# Patient Record
Sex: Female | Born: 1995 | Hispanic: Yes | Marital: Single | Smoking: Never smoker
Health system: Southern US, Community
[De-identification: ages and names within clinical notes are randomized; demographics above are authoritative.]

## PROBLEM LIST (undated history)

## (undated) DIAGNOSIS — D649 Anemia, unspecified: Secondary | ICD-10-CM

## (undated) DIAGNOSIS — Z789 Other specified health status: Secondary | ICD-10-CM

## (undated) HISTORY — PX: NO PAST SURGERIES: SHX2092

## (undated) HISTORY — DX: Other specified health status: Z78.9

---

## 2020-09-11 ENCOUNTER — Inpatient Hospital Stay (HOSPITAL_COMMUNITY): Payer: Self-pay

## 2020-09-11 ENCOUNTER — Other Ambulatory Visit: Payer: Self-pay

## 2020-09-11 ENCOUNTER — Inpatient Hospital Stay (HOSPITAL_COMMUNITY)
Admission: AD | Admit: 2020-09-11 | Discharge: 2020-09-11 | Disposition: A | Discharge status: Home / Self Care | Payer: Self-pay | Attending: Obstetrics and Gynecology | Admitting: Obstetrics and Gynecology

## 2020-09-11 ENCOUNTER — Encounter (HOSPITAL_COMMUNITY): Payer: Self-pay | Admitting: Obstetrics and Gynecology

## 2020-09-11 DIAGNOSIS — O200 Threatened abortion: Secondary | ICD-10-CM | POA: Insufficient documentation

## 2020-09-11 DIAGNOSIS — Z6740 Type O blood, Rh positive: Secondary | ICD-10-CM | POA: Insufficient documentation

## 2020-09-11 DIAGNOSIS — Z3A10 10 weeks gestation of pregnancy: Secondary | ICD-10-CM | POA: Insufficient documentation

## 2020-09-11 DIAGNOSIS — O4690 Antepartum hemorrhage, unspecified, unspecified trimester: Secondary | ICD-10-CM

## 2020-09-11 LAB — CBC WITH DIFFERENTIAL/PLATELET
Abs Immature Granulocytes: 0.01 10*3/uL (ref 0.00–0.07)
Basophils Absolute: 0 10*3/uL (ref 0.0–0.1)
Basophils Relative: 1 %
Eosinophils Absolute: 0.2 10*3/uL (ref 0.0–0.5)
Eosinophils Relative: 2 %
HCT: 33.8 % — ABNORMAL LOW (ref 36.0–46.0)
Hemoglobin: 11.5 g/dL — ABNORMAL LOW (ref 12.0–15.0)
Immature Granulocytes: 0 %
Lymphocytes Relative: 31 %
Lymphs Abs: 2 10*3/uL (ref 0.7–4.0)
MCH: 29.6 pg (ref 26.0–34.0)
MCHC: 34 g/dL (ref 30.0–36.0)
MCV: 86.9 fL (ref 80.0–100.0)
Monocytes Absolute: 0.5 10*3/uL (ref 0.1–1.0)
Monocytes Relative: 7 %
Neutro Abs: 3.9 10*3/uL (ref 1.7–7.7)
Neutrophils Relative %: 59 %
Platelets: 240 10*3/uL (ref 150–400)
RBC: 3.89 MIL/uL (ref 3.87–5.11)
RDW: 12.1 % (ref 11.5–15.5)
WBC: 6.6 10*3/uL (ref 4.0–10.5)
nRBC: 0 % (ref 0.0–0.2)

## 2020-09-11 LAB — URINALYSIS, ROUTINE W REFLEX MICROSCOPIC
Bilirubin Urine: NEGATIVE
Glucose, UA: NEGATIVE mg/dL
Ketones, ur: NEGATIVE mg/dL
Leukocytes,Ua: NEGATIVE
Nitrite: NEGATIVE
Protein, ur: NEGATIVE mg/dL
Specific Gravity, Urine: 1.005 (ref 1.005–1.030)
pH: 6 (ref 5.0–8.0)

## 2020-09-11 LAB — COMPREHENSIVE METABOLIC PANEL
ALT: 14 U/L (ref 0–44)
AST: 15 U/L (ref 15–41)
Albumin: 4.1 g/dL (ref 3.5–5.0)
Alkaline Phosphatase: 38 U/L (ref 38–126)
Anion gap: 10 (ref 5–15)
BUN: 6 mg/dL (ref 6–20)
CO2: 26 mmol/L (ref 22–32)
Calcium: 9.5 mg/dL (ref 8.9–10.3)
Chloride: 99 mmol/L (ref 98–111)
Creatinine, Ser: 0.56 mg/dL (ref 0.44–1.00)
GFR, Estimated: >60 mL/min (ref >60)
Glucose, Bld: 86 mg/dL (ref 70–99)
Potassium: 3.9 mmol/L (ref 3.5–5.1)
Sodium: 135 mmol/L (ref 135–145)
Total Bilirubin: 0.8 mg/dL (ref 0.3–1.2)
Total Protein: 6.6 g/dL (ref 6.5–8.1)

## 2020-09-11 LAB — WET PREP, GENITAL
Clue Cells Wet Prep HPF POC: NONE SEEN
Sperm: NONE SEEN
Trich, Wet Prep: NONE SEEN
Yeast Wet Prep HPF POC: NONE SEEN

## 2020-09-11 LAB — HCG, QUANTITATIVE, PREGNANCY: hCG, Beta Chain, Quant, S: 28007 m[IU]/mL — ABNORMAL HIGH (ref <5)

## 2020-09-11 LAB — HIV ANTIBODY (ROUTINE TESTING W REFLEX): HIV Screen 4th Generation wRfx: NONREACTIVE

## 2020-09-11 LAB — ABO/RH: ABO/RH(D): O POS

## 2020-09-11 NOTE — MAU Note (Signed)
+  HPT about a month ago.  Confirmation appt was today. Past 2 wks, has had some bleeding and brown d/c both. Was told to come here. [facility].  Cramping in lower abd.

## 2020-09-11 NOTE — MAU Provider Note (Signed)
History     CSN: [id number]  Arrival date and time: 09/11/20 1622   Event Date/Time   First Provider Initiated Contact with Patient 09/11/20 1809      Chief Complaint  Patient presents with   Vaginal Bleeding   Abdominal Pain   HPI  [name] is a 25 y.o. female G3P1011 @ 109w6d  here in MAU with complaints of vaginal bleeding x 2 weeks. Report dark blood when she wipes. She reports bilateral lower abdominal pain that comes and goes. She has not taken anything for the pain. She reports the pain as 4/10.   OB History     Gravida  3   Para  1   Term  1   Preterm      AB  1   Living  1      SAB  1   IAB      Ectopic      Multiple  1   Live Births  1           Allergies: No Known Allergies  Medications Prior to Admission  Medication Sig Dispense Refill Last Dose   ferrous sulfate 325 (65 FE) MG tablet Take 325 mg by mouth daily with breakfast.      Multiple Vitamin (MULTIVITAMIN) tablet Take 1 tablet by mouth daily.      Results for orders placed or performed during the hospital encounter of 09/11/20 (from the past 48 hour(s))  Urinalysis, Routine w reflex microscopic Urine, Clean Catch     Status: Abnormal   Collection Time: 09/11/20  6:02 PM  Result Value Ref Range   Color, Urine STRAW (A) YELLOW   APPearance CLEAR CLEAR   Specific Gravity, Urine 1.005 1.005 - 1.030   pH 6.0 5.0 - 8.0   Glucose, UA NEGATIVE NEGATIVE mg/dL   Hgb urine dipstick SMALL (A) NEGATIVE   Bilirubin Urine NEGATIVE NEGATIVE   Ketones, ur NEGATIVE NEGATIVE mg/dL   Protein, ur NEGATIVE NEGATIVE mg/dL   Nitrite NEGATIVE NEGATIVE   Leukocytes,Ua NEGATIVE NEGATIVE   RBC / HPF 0-5 0 - 5 RBC/hpf   WBC, UA 0-5 0 - 5 WBC/hpf   Bacteria, UA RARE (A) NONE SEEN   Squamous Epithelial / LPF 0-5 0 - 5    Comment: Performed at [facility], [address], [place], [place] [address]  Wet prep, genital     Status: Abnormal   Collection Time: 09/11/20  6:30 PM   Result Value Ref Range   Yeast Wet Prep HPF POC NONE SEEN NONE SEEN   Trich, Wet Prep NONE SEEN NONE SEEN   Clue Cells Wet Prep HPF POC NONE SEEN NONE SEEN   WBC, Wet Prep HPF POC FEW (A) NONE SEEN   Sperm NONE SEEN     Comment: Performed at [facility], [address] [place], [place] [address]  CBC with Differential/Platelet     Status: Abnormal   Collection Time: 09/11/20  6:34 PM  Result Value Ref Range   WBC 6.6 4.0 - 10.5 K/uL   RBC 3.89 3.87 - 5.11 MIL/uL   Hemoglobin 11.5 (L) 12.0 - 15.0 g/dL   HCT 86.7 (L) 61.9 - 50.9 %   MCV 86.9 80.0 - 100.0 fL   MCH 29.6 26.0 - 34.0 pg   MCHC 34.0 30.0 - 36.0 g/dL   RDW 32.6 71.2 - 45.8 %   Platelets 240 150 - 400 K/uL   nRBC 0.0 0.0 -  0.2 %   Neutrophils Relative % 59 %   Neutro Abs 3.9 1.7 - 7.7 K/uL   Lymphocytes Relative 31 %   Lymphs Abs 2.0 0.7 - 4.0 K/uL   Monocytes Relative 7 %   Monocytes Absolute 0.5 0.1 - 1.0 K/uL   Eosinophils Relative 2 %   Eosinophils Absolute 0.2 0.0 - 0.5 K/uL   Basophils Relative 1 %   Basophils Absolute 0.0 0.0 - 0.1 K/uL   Immature Granulocytes 0 %   Abs Immature Granulocytes 0.01 0.00 - 0.07 K/uL    Comment: Performed at [facility], [address], [place], [place] [address]  ABO/Rh     Status: None   Collection Time: 09/11/20  6:34 PM  Result Value Ref Range   ABO/RH(D)      O POS Performed at [facility], [address], [place], [place] [address]     US OB LESS THAN 14 WEEKS WITH [place] TRANSVAGINAL  Result Date: 09/11/2020 CLINICAL DATA:  Pregnant patient.  Cramping. EXAM: OBSTETRIC <14 WK [place] AND TRANSVAGINAL OB US TECHNIQUE: Both transabdominal and transvaginal ultrasound examinations were performed for complete evaluation of the gestation as well as the maternal uterus, adnexal regions, and pelvic cul-de-sac. Transvaginal technique was performed to assess early pregnancy. COMPARISON:  None. FINDINGS: Intrauterine gestational sac: Single Yolk sac:  Not Visualized.  Embryo:  Possible fetal pole. Cardiac Activity: Not Visualized. MSD:   mm    w     d CRL:  2.6 mm   5 w   5 d                  [place] EDC: May 09, 2021 Subchorionic hemorrhage:  None visualized. Maternal uterus/adnexae: Normal. IMPRESSION: 1. There appears to be a gestational sac with a possible fetal pole but no yolk sac. No cardiac activity identified in the possible fetal pole. Findings are suspicious but not yet definitive for failed pregnancy. Recommend follow-up US in 10-14 days for definitive diagnosis. This recommendation follows SRU consensus guidelines: Diagnostic Criteria for Nonviable Pregnancy Early in the First Trimester. Malva Limes Med 2013; 098:1191-47. 2. No other abnormalities. Electronically Signed   By: Gerome Sam III M.D   On: 09/11/2020 19:25      Review of Systems  Constitutional:  Negative for fever.  Gastrointestinal:  Positive for abdominal pain. Negative for nausea and vomiting.  Genitourinary:  Positive for vaginal bleeding.  Physical Exam   Blood pressure (!) 100/53, pulse 73, temperature 98.6 F (37 C), temperature source Oral, resp. rate 17, height 5\' 1"  (1.549 m), weight 54.4 kg, last menstrual period 06/27/2020, SpO2 100 %, unknown if currently breastfeeding.  Physical Exam Constitutional:      General: She is not in acute distress.    Appearance: She is well-developed. She is not ill-appearing, toxic-appearing or diaphoretic.  Abdominal:     Tenderness: There is abdominal tenderness in the right lower quadrant, periumbilical area, suprapubic area and left lower quadrant. There is no guarding or rebound.  Genitourinary:    Comments: Wet prep and GC collected without speculum. Small amount of pink/ dark red blood noted on swabs. Musculoskeletal:        General: Normal range of motion.  Neurological:     Mental Status: She is alert and oriented to person, place, and time.  Psychiatric:        Behavior: Behavior normal.   MAU Course   Procedures None  MDM  O positive  blood type.  Reviewed [place] in detail with the patient.    Assessment and Plan   A:  1. Type O blood, Rh positive   2. Vaginal bleeding during pregnancy   3. Threatened miscarriage     P:  Discharge home in stable condition Viability scan in 7 days- order placed Bleeding precautions Return to MAU if symptoms worsen   Remmy Riffe, Harolyn Rutherford, NP 09/14/2020 8:41 AM

## 2020-09-14 LAB — GC/CHLAMYDIA PROBE AMP (~~LOC~~) NOT AT ARMC
Chlamydia: NEGATIVE
Comment: NEGATIVE
Comment: NORMAL
Neisseria Gonorrhea: NEGATIVE

## 2020-09-15 ENCOUNTER — Inpatient Hospital Stay (HOSPITAL_COMMUNITY): Payer: Self-pay

## 2020-09-15 ENCOUNTER — Inpatient Hospital Stay (HOSPITAL_COMMUNITY)
Admission: AD | Admit: 2020-09-15 | Discharge: 2020-09-16 | Disposition: A | Discharge status: Home / Self Care | Payer: Self-pay | Attending: Obstetrics and Gynecology | Admitting: Obstetrics and Gynecology

## 2020-09-15 ENCOUNTER — Other Ambulatory Visit: Payer: Self-pay

## 2020-09-15 ENCOUNTER — Encounter (HOSPITAL_COMMUNITY): Payer: Self-pay | Admitting: Obstetrics and Gynecology

## 2020-09-15 DIAGNOSIS — O020 Blighted ovum and nonhydatidiform mole: Secondary | ICD-10-CM | POA: Insufficient documentation

## 2020-09-15 DIAGNOSIS — Z3A11 11 weeks gestation of pregnancy: Secondary | ICD-10-CM | POA: Insufficient documentation

## 2020-09-15 DIAGNOSIS — O4690 Antepartum hemorrhage, unspecified, unspecified trimester: Secondary | ICD-10-CM

## 2020-09-15 LAB — CBC
HCT: 33.5 % — ABNORMAL LOW (ref 36.0–46.0)
Hemoglobin: 11.4 g/dL — ABNORMAL LOW (ref 12.0–15.0)
MCH: 29.7 pg (ref 26.0–34.0)
MCHC: 34 g/dL (ref 30.0–36.0)
MCV: 87.2 fL (ref 80.0–100.0)
Platelets: 245 10*3/uL (ref 150–400)
RBC: 3.84 MIL/uL — ABNORMAL LOW (ref 3.87–5.11)
RDW: 12.1 % (ref 11.5–15.5)
WBC: 7.8 10*3/uL (ref 4.0–10.5)
nRBC: 0 % (ref 0.0–0.2)

## 2020-09-15 LAB — HCG, QUANTITATIVE, PREGNANCY: hCG, Beta Chain, Quant, S: 22670 m[IU]/mL — ABNORMAL HIGH (ref <5)

## 2020-09-15 NOTE — MAU Note (Signed)
PT SAYS SHE WAS HERE Last Friday  for same-   VAG BLEEDING STARTED 2 WEEKS AGO . ON Sunday BLEEDING BECAME HEAVIER AND CLOTS-  TONIGHT NO PAD - BUT HAS BLEEDING WHEN SHE WIPES.  HAS L SIDE ABD PAIN- STARTED Sunday NIGHT - TOOK TYLENOL Q6 HRS- NO RELIEF. NO TYLENOL TODAY

## 2020-09-16 ENCOUNTER — Encounter (HOSPITAL_COMMUNITY): Payer: Self-pay | Admitting: Obstetrics and Gynecology

## 2020-09-16 DIAGNOSIS — O020 Blighted ovum and nonhydatidiform mole: Secondary | ICD-10-CM

## 2020-09-16 NOTE — MAU Provider Note (Signed)
History  Chief Complaint:  Abdominal Pain and Vaginal Bleeding  Kathryn Benjamin is a 25 y.o. G35P1011 female at [redacted]w[redacted]d by LMP presenting with continued vaginal bleeding and pain.  Seen in MAU 6/10 w/ same complaints, at that time BHCG 28,007 and u/s revealed GS, YS not visualized, possible fetal pole [redacted]w[redacted]d w/o FCA. Reports bleeding became heavier over weekend w/ clots, intermittent LLQ pain since Sunday. No fever/chills. Denies abnormal discharge, itching/odor/irritation.    Obstetrical History: OB History     Gravida  3   Para  1   Term  1   Preterm      AB  1   Living  1      SAB  1   IAB      Ectopic      Multiple  1   Live Births  1           Past Medical History: Past Medical History:  Diagnosis Date   Medical history non-contributory     Past Surgical History: Past Surgical History:  Procedure Laterality Date   NO PAST SURGERIES      Social History: Social History   Socioeconomic History   Marital status: Single    Spouse name: Not on file   Number of children: Not on file   Years of education: Not on file   Highest education level: Not on file  Occupational History   Not on file  Tobacco Use   Smoking status: Never   Smokeless tobacco: Never  Substance and Sexual Activity   Alcohol use: Not Currently   Drug use: Never   Sexual activity: Yes  Other Topics Concern   Not on file  Social History Narrative   Not on file   Social Determinants of Health   Financial Resource Strain: Not on file  Food Insecurity: Not on file  Transportation Needs: Not on file  Physical Activity: Not on file  Stress: Not on file  Social Connections: Not on file    Allergies: No Known Allergies  Medications Prior to Admission  Medication Sig Dispense Refill Last Dose   ferrous sulfate 325 (65 FE) MG tablet Take 325 mg by mouth daily with breakfast.   09/15/2020   Multiple Vitamin (MULTIVITAMIN) tablet Take 1 tablet by mouth daily.   09/14/2020     Review of Systems  Pertinent pos/neg as indicated in HPI  Physical Exam  Blood pressure (!) 100/47, pulse 70, temperature 98.4 F (36.9 C), temperature source Oral, resp. rate 20, height 5\' 1"  (1.549 m), weight 54.5 kg, last menstrual period 06/27/2020, unknown if currently breastfeeding. General appearance: alert, cooperative, and no distress Lungs: clear to auscultation bilaterally, normal effort Heart: regular rate and rhythm Abdomen: gravid, soft, slightly tender LLQ Extremities: no edema  Spec exam by Dr. 06/29/2020: cx visually closed, small amt dark red blood in vault, no active bleeding from os Cultures/Specimens: none   MAU Course   Labs:  Results for orders placed or performed during the hospital encounter of 09/15/20 (from the past 24 hour(s))  CBC     Status: Abnormal   Collection Time: 09/15/20  8:01 PM  Result Value Ref Range   WBC 7.8 4.0 - 10.5 K/uL   RBC 3.84 (L) 3.87 - 5.11 MIL/uL   Hemoglobin 11.4 (L) 12.0 - 15.0 g/dL   HCT 09/17/20 (L) 57.8 - 46.9 %   MCV 87.2 80.0 - 100.0 fL   MCH 29.7 26.0 - 34.0 pg   MCHC 34.0  30.0 - 36.0 g/dL   RDW 96.7 59.1 - 63.8 %   Platelets 245 150 - 400 K/uL   nRBC 0.0 0.0 - 0.2 %  hCG, quantitative, pregnancy     Status: Abnormal   Collection Time: 09/15/20  8:01 PM  Result Value Ref Range   hCG, Beta Chain, Quant, S 22,670 (H) <5 mIU/mL    Imaging:  EXAM: OBSTETRIC <14 WK [place] AND TRANSVAGINAL OB US  TECHNIQUE: Both transabdominal and transvaginal ultrasound examinations were performed for complete evaluation of the gestation as well as the maternal uterus, adnexal regions, and pelvic cul-de-sac. Transvaginal technique was performed to assess early pregnancy.  COMPARISON:  September 11, 2020   FINDINGS: Intrauterine gestational sac: Visualized-single with slight irregularity to the sac contour and debris within the sac  Yolk sac:  Not visualized  Embryo:  Not visualized  Cardiac Activity: Not visualized  MSD: 20 mm   6  w   6 d  Subchorionic hemorrhage:  None visualized.  Maternal uterus/adnexae: Cervical os closed. Left ovary measures 3.0 x 2.0 x 1.7 cm. Right ovary measures 3.0 x 1.8 x 2.0 cm. No extrauterine pelvic mass. No free pelvic fluid.   IMPRESSION: There is a slightly irregular gestational sac measuring 20 mm in size which contains debris but no demonstrable fetal pole. No yolk sac appreciable. Findings are suspicious but not yet definitive for failed pregnancy. Recommend follow-up US in 10-14 days for definitive diagnosis. This recommendation follows SRU consensus guidelines: Diagnostic Criteria for Nonviable Pregnancy Early in the First Trimester. Malva Limes Med 2013; 466:5993-57.  Electronically Signed   By: Bretta Bang III M.D.   On: 09/15/2020 21:54  Discussed falling HCGs (was 28,007 4d ago, now 22,670) and u/s report today and the one on 6/10 w/ Dr. Debroah Loop. Failed anembryonic pregnancy dx. Offer expectant management vs cytotec, and f/u in office.  Assessment and Plan  A:  [redacted]w[redacted]d [facility]UP  G3P1011  Failed/anembryonic pregnancy  Rh+ P:  Discharge home  Offered expectant management vs cytotec- pt wants expectant management for now  Reviewed warning s/s, reasons to seek care (hemorrhaging, severe pain, infection, etc)  Already had f/u u/s scheduled 6/23  Note routed to [facility] to call pt to schedule f/u in office next week   Cheral Marker CNM,WHNP-BC 6/15/202212:05 AM

## 2020-09-22 ENCOUNTER — Other Ambulatory Visit: Payer: Self-pay

## 2020-09-22 ENCOUNTER — Ambulatory Visit (INDEPENDENT_AMBULATORY_CARE_PROVIDER_SITE_OTHER): Payer: Self-pay | Admitting: Obstetrics & Gynecology

## 2020-09-22 ENCOUNTER — Encounter: Payer: Self-pay | Admitting: Obstetrics & Gynecology

## 2020-09-22 VITALS — BP 108/64 | HR 74 | Wt 120.2 lb

## 2020-09-22 DIAGNOSIS — O020 Blighted ovum and nonhydatidiform mole: Secondary | ICD-10-CM

## 2020-09-22 NOTE — Progress Notes (Signed)
Ultrasounds Results Note  SUBJECTIVE HPI:  [name] is a 25 y.o. G3P1011 who presents to the [facility] for followup ultrasound results due to probable failed pregnancy The patient had cramps and bleeding and passed tissue. Upon review of the patient's records, patient was first seen in MAU on 6/10 for bleeding and pain.   BHCG on that day was 05397.  Ultrasound showed sac possible pole no YS.  Last seen in MAU on 6/15. BHCG was 67341.  Repeat ultrasound is scheduled in 2 days  Past Medical History:  Diagnosis Date   Medical history non-contributory    Past Surgical History:  Procedure Laterality Date   NO PAST SURGERIES     Social History   Socioeconomic History   Marital status: Single    Spouse name: Not on file   Number of children: Not on file   Years of education: Not on file   Highest education level: Not on file  Occupational History   Not on file  Tobacco Use   Smoking status: Never   Smokeless tobacco: Never  Substance and Sexual Activity   Alcohol use: Not Currently   Drug use: Never   Sexual activity: Yes  Other Topics Concern   Not on file  Social History Narrative   Not on file   Social Determinants of Health   Financial Resource Strain: Not on file  Food Insecurity: Food Insecurity Present   Worried About Running Out of Food in the Last Year: Sometimes true   Ran Out of Food in the Last Year: Sometimes true  Transportation Needs: No Transportation Needs   Lack of Transportation (Medical): No   Lack of Transportation (Non-Medical): No  Physical Activity: Not on file  Stress: Not on file  Social Connections: Not on file  Intimate Partner Violence: Not on file   Current Outpatient Medications on File Prior to Visit  Medication Sig Dispense Refill   ferrous sulfate 325 (65 FE) MG tablet Take 325 mg by mouth daily with breakfast. (Patient not taking: Reported on 09/22/2020)     Multiple Vitamin (MULTIVITAMIN) tablet Take 1 tablet by mouth  daily. (Patient not taking: Reported on 09/22/2020)     No current facility-administered medications on file prior to visit.   No Known Allergies  I have reviewed patient's Past Medical Hx, Surgical Hx, Family Hx, Social Hx, medications and allergies.   Review of Systems Review of Systems  Constitutional: Negative for fever and chills.  Gastrointestinal: Negative for nausea, vomiting, abdominal pain, diarrhea and constipation.  Genitourinary: Negative for dysuria.  Musculoskeletal: Negative for back pain.  Neurological: Negative for dizziness and weakness.    Physical Exam  BP 108/64   Pulse 74   Wt 120 lb 3.2 oz (54.5 kg)   LMP 06/27/2020   Breastfeeding Unknown   BMI 22.71 kg/m   GENERAL: Well-developed, well-nourished female in no acute distress.  HEENT: Normocephalic, atraumatic.   LUNGS: Effort normal ABDOMEN: soft, non-tender HEART: Regular rate  SKIN: Warm, dry and without erythema PSYCH: Normal mood and affect NEURO: Alert and oriented x 4  LAB RESULTS No results found for this or any previous visit (from the past 24 hour(s)).  IMAGING US OB LESS THAN 14 WEEKS WITH OB TRANSVAGINAL  Result Date: 09/15/2020 CLINICAL DATA:  Vaginal bleeding EXAM: OBSTETRIC <14 WK [place] AND TRANSVAGINAL OB US TECHNIQUE: Both transabdominal and transvaginal ultrasound examinations were performed for complete evaluation of the gestation as well as the maternal uterus, adnexal regions, and  pelvic cul-de-sac. Transvaginal technique was performed to assess early pregnancy. COMPARISON:  September 11, 2020 FINDINGS: Intrauterine gestational sac: Visualized-single with slight irregularity to the sac contour and debris within the sac Yolk sac:  Not visualized Embryo:  Not visualized Cardiac Activity: Not visualized MSD: 20 mm   6 w   6 d Subchorionic hemorrhage:  None visualized. Maternal uterus/adnexae: Cervical os closed. Left ovary measures 3.0 x 2.0 x 1.7 cm. Right ovary measures 3.0 x 1.8 x 2.0 cm. No  extrauterine pelvic mass. No free pelvic fluid. IMPRESSION: There is a slightly irregular gestational sac measuring 20 mm in size which contains debris but no demonstrable fetal pole. No yolk sac appreciable. Findings are suspicious but not yet definitive for failed pregnancy. Recommend follow-up US in 10-14 days for definitive diagnosis. This recommendation follows SRU consensus guidelines: Diagnostic Criteria for Nonviable Pregnancy Early in the First Trimester. Malva Limes Med 2013; 469:6295-28. Electronically Signed   By: Bretta Bang III M.D.   On: 09/15/2020 21:54   US OB LESS THAN 14 WEEKS WITH OB TRANSVAGINAL  Result Date: 09/11/2020 CLINICAL DATA:  Pregnant patient.  Cramping. EXAM: OBSTETRIC <14 WK [place] AND TRANSVAGINAL OB US TECHNIQUE: Both transabdominal and transvaginal ultrasound examinations were performed for complete evaluation of the gestation as well as the maternal uterus, adnexal regions, and pelvic cul-de-sac. Transvaginal technique was performed to assess early pregnancy. COMPARISON:  None. FINDINGS: Intrauterine gestational sac: Single Yolk sac:  Not Visualized. Embryo:  Possible fetal pole. Cardiac Activity: Not Visualized. MSD:   mm    w     d CRL:  2.6 mm   5 w   5 d                  [place] EDC: May 09, 2021 Subchorionic hemorrhage:  None visualized. Maternal uterus/adnexae: Normal. IMPRESSION: 1. There appears to be a gestational sac with a possible fetal pole but no yolk sac. No cardiac activity identified in the possible fetal pole. Findings are suspicious but not yet definitive for failed pregnancy. Recommend follow-up US in 10-14 days for definitive diagnosis. This recommendation follows SRU consensus guidelines: Diagnostic Criteria for Nonviable Pregnancy Early in the First Trimester. Malva Limes Med 2013; 413:2440-10. 2. No other abnormalities. Electronically Signed   By: Gerome Sam III M.D   On: 09/11/2020 19:25    ASSESSMENT 1. Anembryonic pregnancy    Suspect complete  miscarriage now PLAN Discharge home in stable condition  F/U US at [facility] in 2 days RTC in 2 weeks. Bleeding precautions given  Patient counseled using video interpreter  Adam Phenix, MD  09/22/2020  10:42 AM

## 2020-09-24 ENCOUNTER — Ambulatory Visit (HOSPITAL_COMMUNITY): Admission: RE | Admit: 2020-09-24 | Payer: Self-pay | Source: Ambulatory Visit

## 2020-09-24 ENCOUNTER — Encounter: Payer: Self-pay | Admitting: Obstetrics & Gynecology

## 2020-09-24 ENCOUNTER — Other Ambulatory Visit (HOSPITAL_COMMUNITY): Payer: Self-pay | Admitting: Obstetrics and Gynecology

## 2020-09-24 ENCOUNTER — Ambulatory Visit (HOSPITAL_COMMUNITY)
Admission: RE | Admit: 2020-09-24 | Discharge: 2020-09-24 | Disposition: A | Discharge status: Home / Self Care | Payer: Self-pay | Source: Ambulatory Visit | Attending: Obstetrics and Gynecology | Admitting: Obstetrics and Gynecology

## 2020-09-24 ENCOUNTER — Ambulatory Visit (INDEPENDENT_AMBULATORY_CARE_PROVIDER_SITE_OTHER): Payer: Self-pay | Admitting: Obstetrics & Gynecology

## 2020-09-24 ENCOUNTER — Other Ambulatory Visit: Payer: Self-pay

## 2020-09-24 VITALS — BP 102/54 | HR 61 | Wt 118.9 lb

## 2020-09-24 DIAGNOSIS — O4690 Antepartum hemorrhage, unspecified, unspecified trimester: Secondary | ICD-10-CM | POA: Insufficient documentation

## 2020-09-24 DIAGNOSIS — O200 Threatened abortion: Secondary | ICD-10-CM | POA: Insufficient documentation

## 2020-09-24 DIAGNOSIS — Z6740 Type O blood, Rh positive: Secondary | ICD-10-CM | POA: Insufficient documentation

## 2020-09-24 DIAGNOSIS — O020 Blighted ovum and nonhydatidiform mole: Secondary | ICD-10-CM

## 2020-09-24 IMAGING — US US OB TRANSVAGINAL
1 series · 15 of 27 positions shown · non-contrast
Comparison: [DATE]

CLINICAL DATA: Follow-up viability.  Down trending beta HCG

EXAM:
TRANSVAGINAL OB ULTRASOUND
TECHNIQUE: Transvaginal ultrasound was performed for complete evaluation of the
gestation as well as the maternal uterus, adnexal regions, and
pelvic cul-de-sac.

[Series 1: us ob transvaginal · 27 acquisitions, 15 frames shown]
[im 1/27]
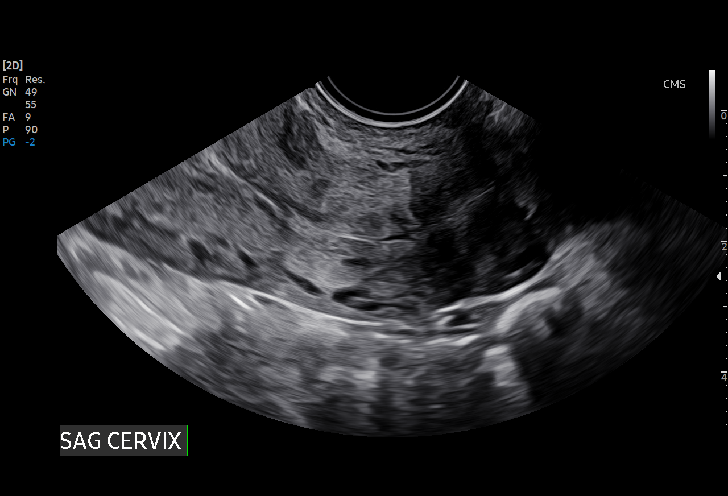
[im 3/27]
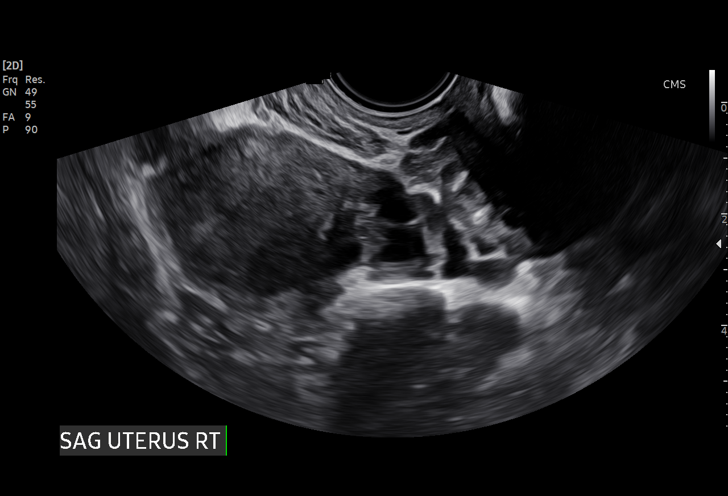
[im 5/27]
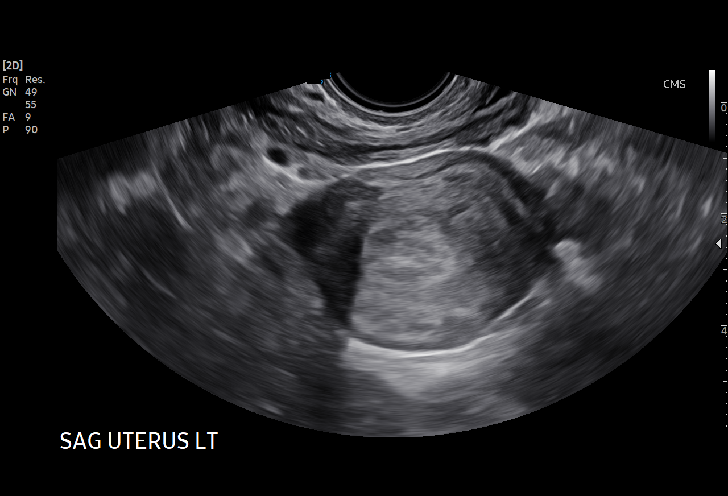
[im 7/27]
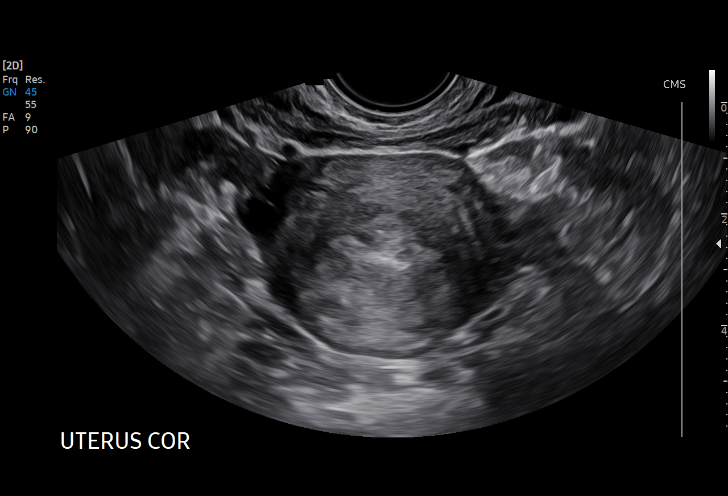
[im 9/27]
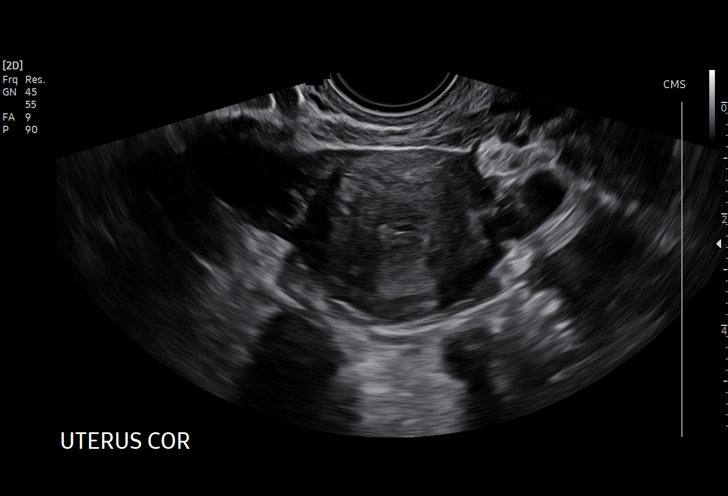
[im 10/27]
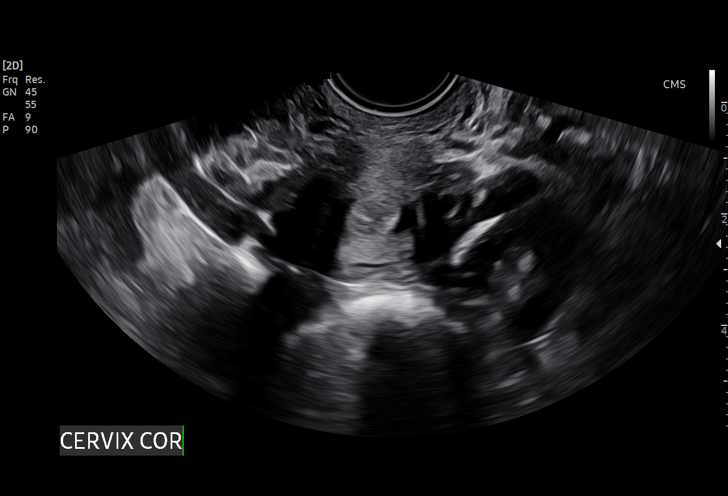
[im 12/27]
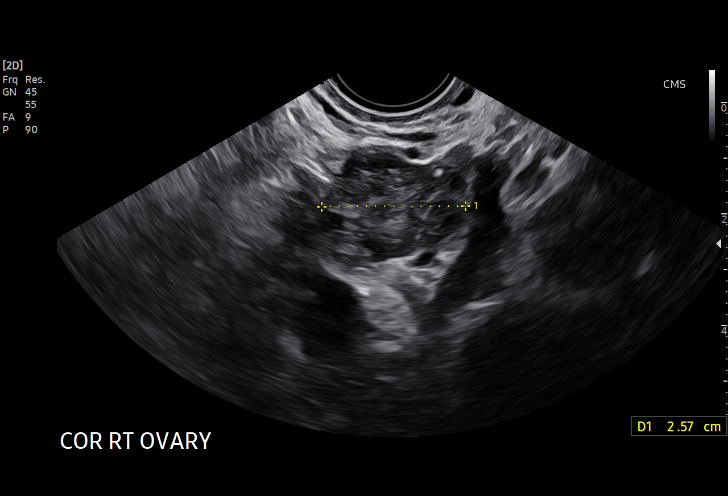
[im 14/27]
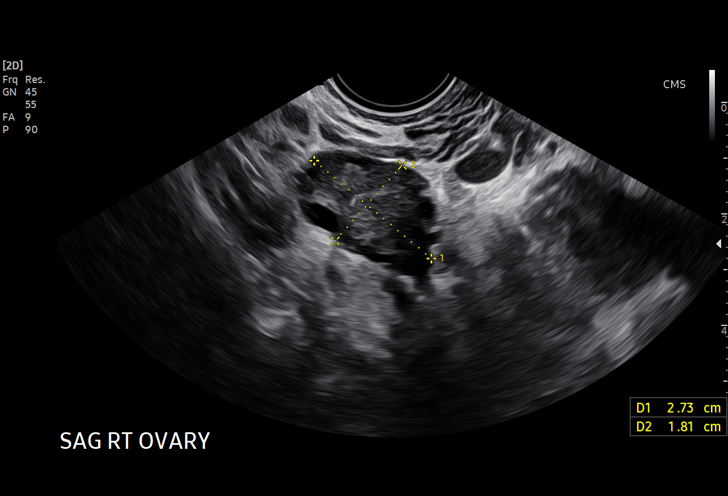
[im 16/27]
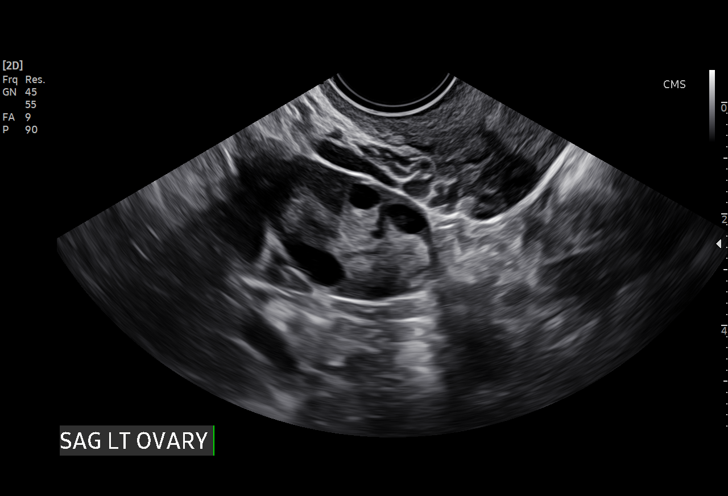
[im 18/27]
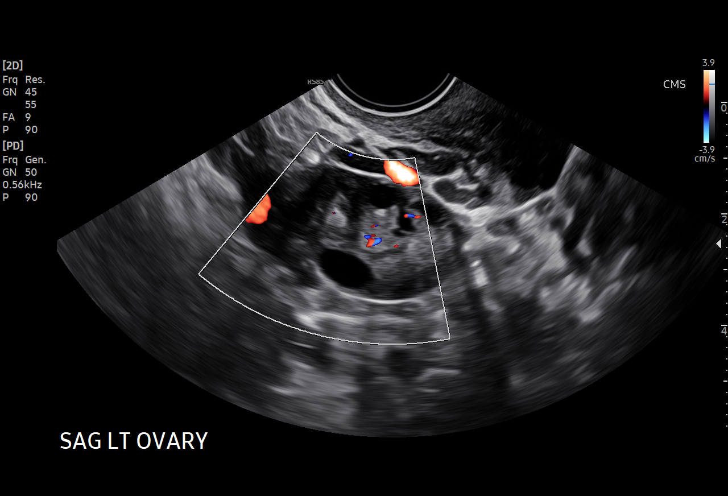
[im 19/27]
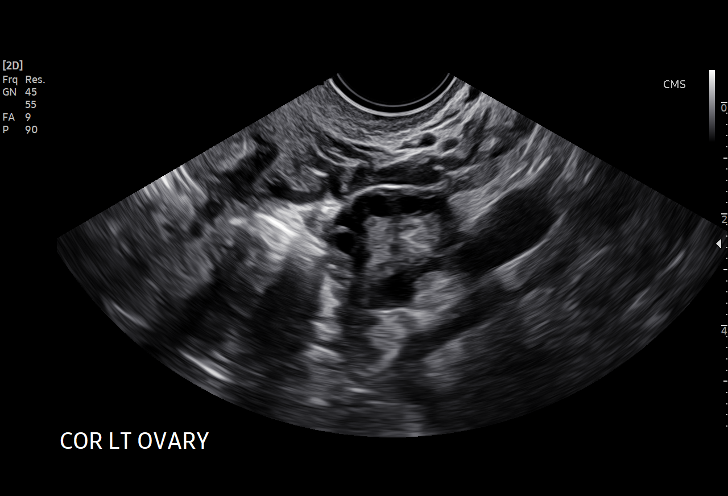
[im 21/27]
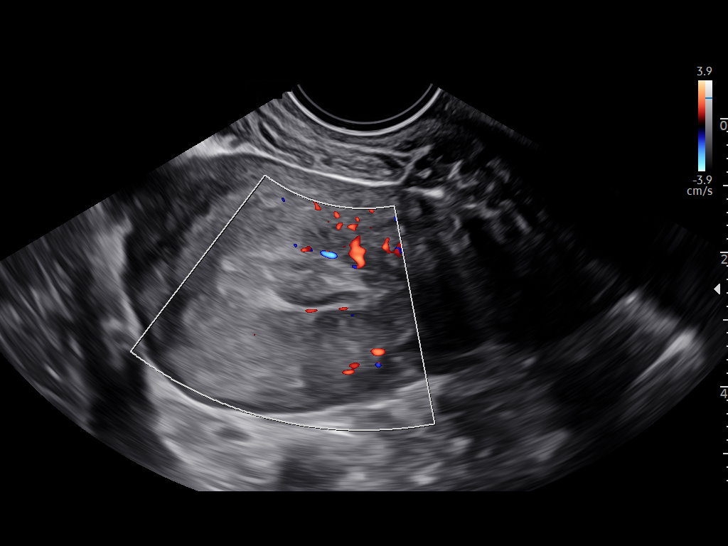
[im 23/27]
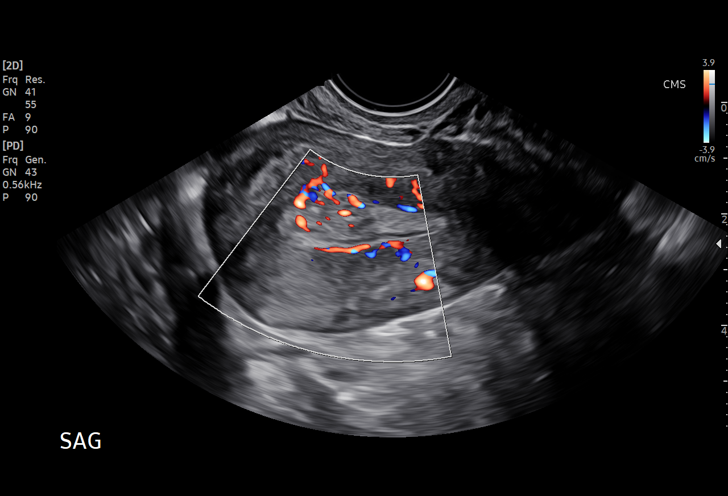
[im 25/27]
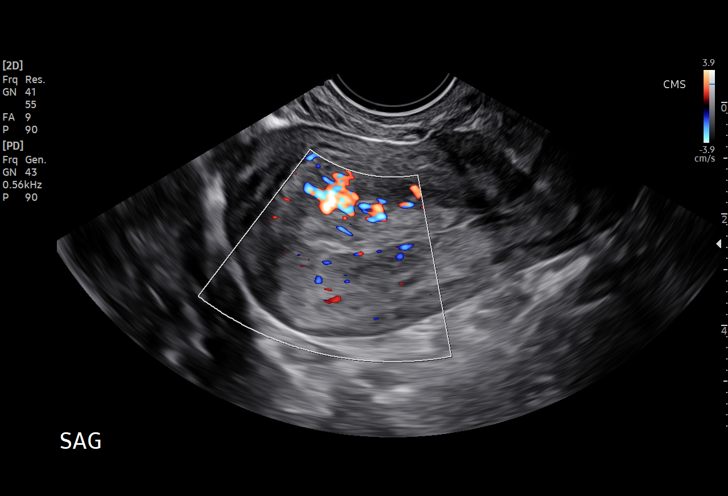
[im 27/27]
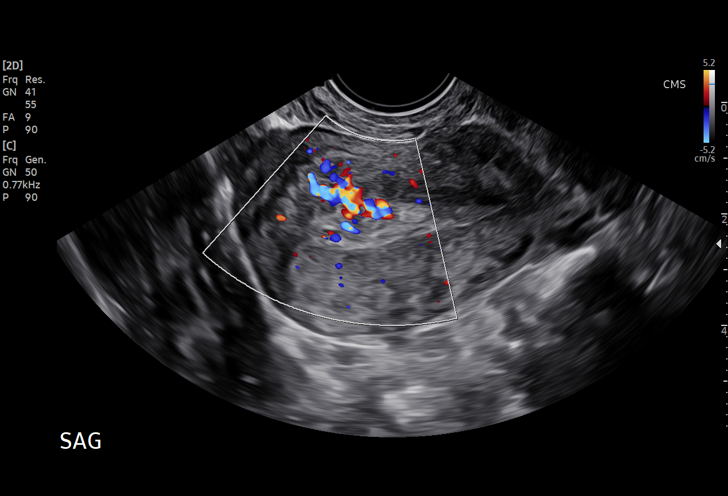

[15 of 27 positions shown; findings below may reference images not displayed]

FINDINGS: Intrauterine gestational sac: None

Yolk sac:  Not Visualized.

Embryo:  Not Visualized.

Cardiac Activity: Not Visualized.

Subchorionic hemorrhage:  None visualized.

Maternal uterus/adnexae: Endometrial complex measures approximately
10 mm in thickness and appears heterogeneous with increased internal
vascularity. Right ovary measures 2.6 x 1.8 x 2.7 cm and appears
unremarkable. Left ovary measures 3.5 x 2.3 x 2.4 cm and appears
unremarkable. No adnexal mass identified. No free fluid within the
pelvis.
IMPRESSION: 1. No evidence of an intrauterine pregnancy. Previously seen
intrauterine gestational sac is no longer identified. Heterogeneous
appearance of the endometrial complex with increased vascularity is
concerning for retained products of conception.
2. Unremarkable appearance of the ovaries. No adnexal masses or free
fluid.

These results will be called to the ordering clinician or
representative by the Radiologist Assistant, and communication
documented in the PACS or [REDACTED].

## 2020-09-25 ENCOUNTER — Other Ambulatory Visit: Payer: Self-pay | Admitting: Obstetrics & Gynecology

## 2020-09-25 DIAGNOSIS — O020 Blighted ovum and nonhydatidiform mole: Secondary | ICD-10-CM

## 2020-09-25 MED ORDER — MISOPROSTOL 200 MCG PO TABS: ORAL_TABLET | ORAL | 0 refills | Status: DC

## 2020-09-25 MED ORDER — IBUPROFEN 600 MG PO TABS: 600.0000 mg | ORAL_TABLET | Freq: Four times a day (QID) | ORAL | 1 refills | Status: DC | PRN

## 2020-09-25 NOTE — Progress Notes (Signed)
TELEHEALTH GYNECOLOGY VISIT ENCOUNTER NOTE  Provider [facility]   Patient location: Home  I connected with Kathryn PrudeGabriela Miranda Benjamin on 09/25/20 at  by telephone and verified that I am speaking with the correct person using two identifiers. Patient was unable to do MyChart audiovisual encounter due to technical difficulties, she tried several times.    I discussed the limitations, risks, security and privacy concerns of performing an evaluation and management service by telephone and the availability of in person appointments. I also discussed with the patient that there may be a patient responsible charge related to this service. The patient expressed understanding and agreed to proceed.   History:  Kathryn PrudeGabriela Miranda Benjamin is a 25 y.o. [phone] [id number] female being evaluated today for incomplete abortion. She denies any abnormal vaginal discharge, bleeding, pelvic pain or other concerns.    She had an US yesterday and possible retained POC was seen. I [facility] to [facility] assisting with Spanish.   Past Medical History:  Diagnosis Date   Medical history non-contributory    Past Surgical History:  Procedure Laterality Date   NO PAST SURGERIES     The following portions of the patient's history were reviewed and updated as appropriate: allergies, current medications, past family history, past medical history, past social history, past surgical history and problem list.     Review of Systems:  Pertinent items noted in HPI and remainder of comprehensive ROS otherwise negative.  Physical Exam:   Phone call no video  Labs and Imaging Results for orders placed or performed during the hospital encounter of 09/15/20 (from the past 336 hour(s))  CBC   Collection Time: 09/15/20  8:01 PM  Result Value Ref Range   WBC 7.8 4.0 - 10.5 K/uL   RBC 3.84 (L) 3.87 - 5.11 MIL/uL   Hemoglobin 11.4 (L) 12.0 - 15.0 g/dL   HCT  47.433.5 (L) 25.936.0 - 46.0 %   MCV 87.2 80.0 - 100.0 fL   MCH 29.7 26.0 - 34.0 pg   MCHC 34.0 30.0 - 36.0 g/dL   RDW 56.312.1 87.511.5 - 64.315.5 %   Platelets 245 150 - 400 K/uL   nRBC 0.0 0.0 - 0.2 %  hCG, quantitative, pregnancy   Collection Time: 09/15/20  8:01 PM  Result Value Ref Range   hCG, Beta Chain, Quant, S 22,670 (H) <5 mIU/mL  Results for orders placed or performed during the hospital encounter of 09/11/20 (from the past 336 hour(s))  GC/Chlamydia probe amp ([place])[facility]   Collection Time: 09/11/20  6:00 PM  Result Value Ref Range   Neisseria Gonorrhea Negative    Chlamydia Negative    Comment Normal Reference Ranger Chlamydia - Negative    Comment      Normal Reference Range Neisseria Gonorrhea - Negative  Urinalysis, Routine w reflex microscopic Urine, Clean Catch   Collection Time: 09/11/20  6:02 PM  Result Value Ref Range   Color, Urine STRAW (A) YELLOW   APPearance CLEAR CLEAR   Specific Gravity, Urine 1.005 1.005 - 1.030   pH 6.0 5.0 - 8.0   Glucose, UA NEGATIVE NEGATIVE mg/dL   Hgb urine dipstick SMALL (A) NEGATIVE   Bilirubin Urine NEGATIVE NEGATIVE   Ketones, ur NEGATIVE NEGATIVE mg/dL   Protein, ur NEGATIVE NEGATIVE mg/dL   Nitrite NEGATIVE NEGATIVE   Leukocytes,Ua NEGATIVE NEGATIVE   RBC / HPF 0-5 0 - 5 RBC/hpf   WBC, UA 0-5  0 - 5 WBC/hpf   Bacteria, UA RARE (A) NONE SEEN   Squamous Epithelial / LPF 0-5 0 - 5  Wet prep, genital   Collection Time: 09/11/20  6:30 PM  Result Value Ref Range   Yeast Wet Prep HPF POC NONE SEEN NONE SEEN   Trich, Wet Prep NONE SEEN NONE SEEN   Clue Cells Wet Prep HPF POC NONE SEEN NONE SEEN   WBC, Wet Prep HPF POC FEW (A) NONE SEEN   Sperm NONE SEEN   CBC with Differential/Platelet   Collection Time: 09/11/20  6:34 PM  Result Value Ref Range   WBC 6.6 4.0 - 10.5 K/uL   RBC 3.89 3.87 - 5.11 MIL/uL   Hemoglobin 11.5 (L) 12.0 - 15.0 g/dL   HCT 70.2 (L) 63.7 - 85.8 %   MCV 86.9 80.0 - 100.0 fL   MCH 29.6 26.0 - 34.0 pg    MCHC 34.0 30.0 - 36.0 g/dL   RDW 85.0 27.7 - 41.2 %   Platelets 240 150 - 400 K/uL   nRBC 0.0 0.0 - 0.2 %   Neutrophils Relative % 59 %   Neutro Abs 3.9 1.7 - 7.7 K/uL   Lymphocytes Relative 31 %   Lymphs Abs 2.0 0.7 - 4.0 K/uL   Monocytes Relative 7 %   Monocytes Absolute 0.5 0.1 - 1.0 K/uL   Eosinophils Relative 2 %   Eosinophils Absolute 0.2 0.0 - 0.5 K/uL   Basophils Relative 1 %   Basophils Absolute 0.0 0.0 - 0.1 K/uL   Immature Granulocytes 0 %   Abs Immature Granulocytes 0.01 0.00 - 0.07 K/uL  Comprehensive metabolic panel   Collection Time: 09/11/20  6:34 PM  Result Value Ref Range   Sodium 135 135 - 145 mmol/L   Potassium 3.9 3.5 - 5.1 mmol/L   Chloride 99 98 - 111 mmol/L   CO2 26 22 - 32 mmol/L   Glucose, Bld 86 70 - 99 mg/dL   BUN 6 6 - 20 mg/dL   Creatinine, Ser 8.78 0.44 - 1.00 mg/dL   Calcium 9.5 8.9 - 67.6 mg/dL   Total Protein 6.6 6.5 - 8.1 g/dL   Albumin 4.1 3.5 - 5.0 g/dL   AST 15 15 - 41 U/L   ALT 14 0 - 44 U/L   Alkaline Phosphatase 38 38 - 126 U/L   Total Bilirubin 0.8 0.3 - 1.2 mg/dL   GFR, Estimated >72 >09 mL/min   Anion gap 10 5 - 15  hCG, quantitative, pregnancy   Collection Time: 09/11/20  6:34 PM  Result Value Ref Range   hCG, Beta Chain, Quant, S 28,007 (H) <5 mIU/mL  HIV Antibody (routine testing w rflx)   Collection Time: 09/11/20  6:34 PM  Result Value Ref Range   HIV Screen 4th Generation wRfx Non Reactive Non Reactive  ABO/Rh   Collection Time: 09/11/20  6:34 PM  Result Value Ref Range   ABO/RH(D)      O POS Performed at [facility], [address], [place], [place] [address]    [place] Transvaginal  Result Date: 09/24/2020 CLINICAL DATA:  Follow-up viability.  Down trending beta HCG EXAM: TRANSVAGINAL OB ULTRASOUND TECHNIQUE: Transvaginal ultrasound was performed for complete evaluation of the gestation as well as the maternal uterus, adnexal regions, and pelvic cul-de-sac. COMPARISON:  09/15/2020 FINDINGS: Intrauterine  gestational sac: None Yolk sac:  Not Visualized. Embryo:  Not Visualized. Cardiac Activity: Not Visualized. Subchorionic hemorrhage:  None visualized. Maternal  uterus/adnexae: Endometrial complex measures approximately 10 mm in thickness and appears heterogeneous with increased internal vascularity. Right ovary measures 2.6 x 1.8 x 2.7 cm and appears unremarkable. Left ovary measures 3.5 x 2.3 x 2.4 cm and appears unremarkable. No adnexal mass identified. No free fluid within the pelvis. IMPRESSION: 1. No evidence of an intrauterine pregnancy. Previously seen intrauterine gestational sac is no longer identified. Heterogeneous appearance of the endometrial complex with increased vascularity is concerning for retained products of conception. 2. Unremarkable appearance of the ovaries. No adnexal masses or free fluid. These results will be called to the ordering clinician or representative by the Radiologist Assistant, and communication documented in the PACS or [employer]. Electronically Signed   By: Duanne Guess D.O.   On: 09/24/2020 14:57   US OB LESS THAN 14 WEEKS WITH OB TRANSVAGINAL  Result Date: 09/15/2020 CLINICAL DATA:  Vaginal bleeding EXAM: OBSTETRIC <14 WK [place] AND TRANSVAGINAL OB US TECHNIQUE: Both transabdominal and transvaginal ultrasound examinations were performed for complete evaluation of the gestation as well as the maternal uterus, adnexal regions, and pelvic cul-de-sac. Transvaginal technique was performed to assess early pregnancy. COMPARISON:  September 11, 2020 FINDINGS: Intrauterine gestational sac: Visualized-single with slight irregularity to the sac contour and debris within the sac Yolk sac:  Not visualized Embryo:  Not visualized Cardiac Activity: Not visualized MSD: 20 mm   6 w   6 d Subchorionic hemorrhage:  None visualized. Maternal uterus/adnexae: Cervical os closed. Left ovary measures 3.0 x 2.0 x 1.7 cm. Right ovary measures 3.0 x 1.8 x 2.0 cm. No extrauterine pelvic mass. No  free pelvic fluid. IMPRESSION: There is a slightly irregular gestational sac measuring 20 mm in size which contains debris but no demonstrable fetal pole. No yolk sac appreciable. Findings are suspicious but not yet definitive for failed pregnancy. Recommend follow-up US in 10-14 days for definitive diagnosis. This recommendation follows SRU consensus guidelines: Diagnostic Criteria for Nonviable Pregnancy Early in the First Trimester. Malva Limes Med 2013; 502:7741-28. Electronically Signed   By: Bretta Bang III M.D.   On: 09/15/2020 21:54   US OB LESS THAN 14 WEEKS WITH OB TRANSVAGINAL  Result Date: 09/11/2020 CLINICAL DATA:  Pregnant patient.  Cramping. EXAM: OBSTETRIC <14 WK [place] AND TRANSVAGINAL OB US TECHNIQUE: Both transabdominal and transvaginal ultrasound examinations were performed for complete evaluation of the gestation as well as the maternal uterus, adnexal regions, and pelvic cul-de-sac. Transvaginal technique was performed to assess early pregnancy. COMPARISON:  None. FINDINGS: Intrauterine gestational sac: Single Yolk sac:  Not Visualized. Embryo:  Possible fetal pole. Cardiac Activity: Not Visualized. MSD:   mm    w     d CRL:  2.6 mm   5 w   5 d                  [place] EDC: May 09, 2021 Subchorionic hemorrhage:  None visualized. Maternal uterus/adnexae: Normal. IMPRESSION: 1. There appears to be a gestational sac with a possible fetal pole but no yolk sac. No cardiac activity identified in the possible fetal pole. Findings are suspicious but not yet definitive for failed pregnancy. Recommend follow-up US in 10-14 days for definitive diagnosis. This recommendation follows SRU consensus guidelines: Diagnostic Criteria for Nonviable Pregnancy Early in the First Trimester. Malva Limes Med 2013; 786:7672-09. 2. No other abnormalities. Electronically Signed   By: Gerome Sam III M.D   On: 09/11/2020 19:25      Assessment and Plan:  1. Anembryonic pregnancy Retained POC discussed with the  patient and she accepts medical management with Cytotec - misoprostol (CYTOTEC) 200 MCG tablet; Place three tablets in the vagina the night prior to your next clinic appointment  Dispense: 3 tablet; Refill: 0 Meds ordered this encounter  Medications   misoprostol (CYTOTEC) 200 MCG tablet    Sig: Place three tablets in the vagina the night prior to your next clinic appointment    Dispense:  3 tablet    Refill:  0   ibuprofen (ADVIL) 600 MG tablet    Sig: Take 1 tablet (600 mg total) by mouth every 6 (six) hours as needed.    Dispense:  30 tablet    Refill:  1         I discussed the assessment and treatment plan with the patient. The patient was provided an opportunity to ask questions and all were answered. The patient agreed with the plan and demonstrated an understanding of the instructions.   The patient was advised to call back or seek an in-person evaluation/go to the ED if the symptoms worsen or if the condition fails to improve as anticipated.  I provided 12 minutes of non-face-to-face time during this encounter.   Scheryl Darter, MD [facility], [facility]

## 2020-10-12 ENCOUNTER — Other Ambulatory Visit: Payer: Self-pay

## 2020-10-12 ENCOUNTER — Ambulatory Visit (INDEPENDENT_AMBULATORY_CARE_PROVIDER_SITE_OTHER): Payer: Self-pay | Admitting: Obstetrics and Gynecology

## 2020-10-12 ENCOUNTER — Encounter: Payer: Self-pay | Admitting: Obstetrics and Gynecology

## 2020-10-12 VITALS — BP 105/59 | HR 72 | Ht 61.0 in | Wt 122.3 lb

## 2020-10-12 DIAGNOSIS — Z30011 Encounter for initial prescription of contraceptive pills: Secondary | ICD-10-CM

## 2020-10-12 DIAGNOSIS — O020 Blighted ovum and nonhydatidiform mole: Secondary | ICD-10-CM

## 2020-10-12 DIAGNOSIS — Z309 Encounter for contraceptive management, unspecified: Secondary | ICD-10-CM | POA: Insufficient documentation

## 2020-10-12 MED ORDER — DESOGESTREL-ETHINYL ESTRADIOL 0.15-30 MG-MCG PO TABS: 1.0000 | ORAL_TABLET | Freq: Every day | ORAL | 11 refills | Status: DC

## 2020-10-12 NOTE — Patient Instructions (Signed)
Health Maintenance, Female Adopting a healthy lifestyle and getting preventive care are important in promoting health and wellness. Ask your health care provider about: The right schedule for you to have regular tests and exams. Things you can do on your own to prevent diseases and keep yourself healthy. What should I know about diet, weight, and exercise? Eat a healthy diet  Eat a diet that includes plenty of vegetables, fruits, low-fat dairy products, and lean protein. Do not eat a lot of foods that are high in solid fats, added sugars, or sodium.  Maintain a healthy weight Body mass index (BMI) is used to identify weight problems. It estimates body fat based on height and weight. Your health care provider can help determineyour BMI and help you achieve or maintain a healthy weight. Get regular exercise Get regular exercise. This is one of the most important things you can do for your health. Most adults should: Exercise for at least 150 minutes each week. The exercise should increase your heart rate and make you sweat (moderate-intensity exercise). Do strengthening exercises at least twice a week. This is in addition to the moderate-intensity exercise. Spend less time sitting. Even light physical activity can be beneficial. Watch cholesterol and blood lipids Have your blood tested for lipids and cholesterol at 26 years of age, then havethis test every 5 years. Have your cholesterol levels checked more often if: Your lipid or cholesterol levels are high. You are older than 26 years of age. You are at high risk for heart disease. What should I know about cancer screening? Depending on your health history and family history, you may need to have cancer screening at various ages. This may include screening for: Breast cancer. Cervical cancer. Colorectal cancer. Skin cancer. Lung cancer. What should I know about heart disease, diabetes, and high blood pressure? Blood pressure and heart  disease High blood pressure causes heart disease and increases the risk of stroke. This is more likely to develop in people who have high blood pressure readings, are of African descent, or are overweight. Have your blood pressure checked: Every 3-5 years if you are 18-39 years of age. Every year if you are 40 years old or older. Diabetes Have regular diabetes screenings. This checks your fasting blood sugar level. Have the screening done: Once every three years after age 40 if you are at a normal weight and have a low risk for diabetes. More often and at a younger age if you are overweight or have a high risk for diabetes. What should I know about preventing infection? Hepatitis B If you have a higher risk for hepatitis B, you should be screened for this virus. Talk with your health care provider to find out if you are at risk forhepatitis B infection. Hepatitis C Testing is recommended for: Everyone born from 1945 through 1965. Anyone with known risk factors for hepatitis C. Sexually transmitted infections (STIs) Get screened for STIs, including gonorrhea and chlamydia, if: You are sexually active and are younger than 26 years of age. You are older than 26 years of age and your health care provider tells you that you are at risk for this type of infection. Your sexual activity has changed since you were last screened, and you are at increased risk for chlamydia or gonorrhea. Ask your health care provider if you are at risk. Ask your health care provider about whether you are at high risk for HIV. Your health care provider may recommend a prescription medicine to help   prevent HIV infection. If you choose to take medicine to prevent HIV, you should first get tested for HIV. You should then be tested every 3 months for as long as you are taking the medicine. Pregnancy If you are about to stop having your period (premenopausal) and you may become pregnant, seek counseling before you get  pregnant. Take 400 to 800 micrograms (mcg) of folic acid every day if you become pregnant. Ask for birth control (contraception) if you want to prevent pregnancy. Osteoporosis and menopause Osteoporosis is a disease in which the bones lose minerals and strength with aging. This can result in bone fractures. If you are 65 years old or older, or if you are at risk for osteoporosis and fractures, ask your health care provider if you should: Be screened for bone loss. Take a calcium or vitamin D supplement to lower your risk of fractures. Be given hormone replacement therapy (HRT) to treat symptoms of menopause. Follow these instructions at home: Lifestyle Do not use any products that contain nicotine or tobacco, such as cigarettes, e-cigarettes, and chewing tobacco. If you need help quitting, ask your health care provider. Do not use street drugs. Do not share needles. Ask your health care provider for help if you need support or information about quitting drugs. Alcohol use Do not drink alcohol if: Your health care provider tells you not to drink. You are pregnant, may be pregnant, or are planning to become pregnant. If you drink alcohol: Limit how much you use to 0-1 drink a day. Limit intake if you are breastfeeding. Be aware of how much alcohol is in your drink. In the [place] one drink equals one 12 oz bottle of beer (355 mL), one 5 oz glass of wine (148 mL), or one 1 oz glass of hard liquor (44 mL). General instructions Schedule regular health, dental, and eye exams. Stay current with your vaccines. Tell your health care provider if: You often feel depressed. You have ever been abused or do not feel safe at home. Summary Adopting a healthy lifestyle and getting preventive care are important in promoting health and wellness. Follow your health care provider's instructions about healthy diet, exercising, and getting tested or screened for diseases. Follow your health care provider's  instructions on monitoring your cholesterol and blood pressure. This information is not intended to replace advice given to you by your health care provider. Make sure you discuss any questions you have with your healthcare provider. Document Revised: 03/14/2018 Document Reviewed: 03/14/2018 Elsevier Patient Education  2022 Elsevier Inc.  

## 2020-10-12 NOTE — Progress Notes (Signed)
Pts states only show light bleeding when she wipes/no pain.

## 2020-10-12 NOTE — Progress Notes (Signed)
Ms Kathryn Benjamin presents for follow up of anembryonic pregnancy. U/S 09/24/20 revealed completed miscarriage Pt reports only some light spotting when she wipes now. Denies any bowel or bladder dysfunction.  Blood type O +  PE AF VSS Lungs clear Heart RRR Abd soft + BS  A/P S/P miscarriage  Reviewed with pt. Will check BHCG today and follow till < 5. Pt desires OCP's. Rx sent to pharmacy. U/R/B and back up method reviewed with pt. Pt needs pap smear,  pt desire to schedule Live interrupter used during today's visit.

## 2020-10-13 ENCOUNTER — Telehealth: Payer: Self-pay | Admitting: General Practice

## 2020-10-13 LAB — BETA HCG QUANT (REF LAB): hCG Quant: 10 m[IU]/mL

## 2020-10-13 NOTE — Telephone Encounter (Signed)
-----   Message from Hermina Staggers, MD sent at 10/13/2020 12:16 PM EDT ----- Repeat BHCG, non stat in 2 weeks.  Thanks Casimiro Needle

## 2020-10-13 NOTE — Telephone Encounter (Signed)
Called patient with Raquel assisting with Spanish interpretation & informed her of results/recommended follow up. Patient verbalized understanding & states she can come 7/27 @ 330. Patient had no questions.

## 2020-10-24 ENCOUNTER — Emergency Department (HOSPITAL_COMMUNITY)
Admission: EM | Admit: 2020-10-24 | Discharge: 2020-10-24 | Disposition: A | Discharge status: Home / Self Care | Payer: Self-pay | Attending: Emergency Medicine | Admitting: Emergency Medicine

## 2020-10-24 ENCOUNTER — Other Ambulatory Visit: Payer: Self-pay

## 2020-10-24 DIAGNOSIS — Z5321 Procedure and treatment not carried out due to patient leaving prior to being seen by health care provider: Secondary | ICD-10-CM | POA: Insufficient documentation

## 2020-10-24 DIAGNOSIS — R109 Unspecified abdominal pain: Secondary | ICD-10-CM | POA: Insufficient documentation

## 2020-10-24 DIAGNOSIS — R112 Nausea with vomiting, unspecified: Secondary | ICD-10-CM | POA: Insufficient documentation

## 2020-10-24 DIAGNOSIS — R82998 Other abnormal findings in urine: Secondary | ICD-10-CM | POA: Insufficient documentation

## 2020-10-24 DIAGNOSIS — R509 Fever, unspecified: Secondary | ICD-10-CM | POA: Insufficient documentation

## 2020-10-24 LAB — URINALYSIS, ROUTINE W REFLEX MICROSCOPIC
Bilirubin Urine: NEGATIVE
Glucose, UA: NEGATIVE mg/dL
Ketones, ur: 20 mg/dL — AB
Nitrite: NEGATIVE
Protein, ur: 100 mg/dL — AB
Specific Gravity, Urine: 1.016 (ref 1.005–1.030)
WBC, UA: >50 WBC/hpf — ABNORMAL HIGH (ref 0–5)
pH: 5 (ref 5.0–8.0)

## 2020-10-24 LAB — CBC
HCT: 37.7 % (ref 36.0–46.0)
Hemoglobin: 12.6 g/dL (ref 12.0–15.0)
MCH: 29.8 pg (ref 26.0–34.0)
MCHC: 33.4 g/dL (ref 30.0–36.0)
MCV: 89.1 fL (ref 80.0–100.0)
Platelets: 236 10*3/uL (ref 150–400)
RBC: 4.23 MIL/uL (ref 3.87–5.11)
RDW: 13 % (ref 11.5–15.5)
WBC: 13.5 10*3/uL — ABNORMAL HIGH (ref 4.0–10.5)
nRBC: 0 % (ref 0.0–0.2)

## 2020-10-24 LAB — COMPREHENSIVE METABOLIC PANEL
ALT: 18 U/L (ref 0–44)
AST: 18 U/L (ref 15–41)
Albumin: 3.9 g/dL (ref 3.5–5.0)
Alkaline Phosphatase: 50 U/L (ref 38–126)
Anion gap: 10 (ref 5–15)
BUN: 12 mg/dL (ref 6–20)
CO2: 23 mmol/L (ref 22–32)
Calcium: 9.3 mg/dL (ref 8.9–10.3)
Chloride: 102 mmol/L (ref 98–111)
Creatinine, Ser: 0.78 mg/dL (ref 0.44–1.00)
GFR, Estimated: >60 mL/min (ref >60)
Glucose, Bld: 94 mg/dL (ref 70–99)
Potassium: 3.5 mmol/L (ref 3.5–5.1)
Sodium: 135 mmol/L (ref 135–145)
Total Bilirubin: 0.9 mg/dL (ref 0.3–1.2)
Total Protein: 7.3 g/dL (ref 6.5–8.1)

## 2020-10-24 LAB — I-STAT BETA HCG BLOOD, ED (MC, WL, AP ONLY): I-stat hCG, quantitative: 5.9 m[IU]/mL — ABNORMAL HIGH (ref <5)

## 2020-10-24 LAB — LIPASE, BLOOD: Lipase: 25 U/L (ref 11–51)

## 2020-10-24 MED ORDER — ONDANSETRON 4 MG PO TBDP
4.0000 mg | ORAL_TABLET | Freq: Once | ORAL | Status: AC
  Administered 2020-10-24: 4 mg via ORAL
  Filled 2020-10-24: qty 1

## 2020-10-24 MED ORDER — ACETAMINOPHEN 500 MG PO TABS: 1000.0000 mg | ORAL_TABLET | Freq: Once | ORAL | Status: DC

## 2020-10-24 MED ORDER — ACETAMINOPHEN 325 MG PO TABS
650.0000 mg | ORAL_TABLET | Freq: Once | ORAL | Status: AC
  Administered 2020-10-24: 650 mg via ORAL
  Filled 2020-10-24: qty 2

## 2020-10-24 MED ORDER — HYDROCODONE-ACETAMINOPHEN 5-325 MG PO TABS
1.0000 | ORAL_TABLET | Freq: Once | ORAL | Status: AC
  Administered 2020-10-24: 1 via ORAL
  Filled 2020-10-24: qty 1

## 2020-10-24 NOTE — ED Provider Notes (Signed)
Emergency Medicine Provider Triage Evaluation Note  Rex Magee , a 25 y.o. female  was evaluated in triage.  Pt complains of right side flank pain, right lower quadrant abdominal pain, urinary symptoms, fever.  Symptoms began about 2 weeks ago.  Patient had a recent miscarriage a few weeks ago.  She reports brown vaginal discharge.  No other medical problems, takes no medications daily except birth control which was started for the recent miscarriage.  Review of Systems  Positive: Fever, flank pain, urinary sxs Negative: Abnormal bm  Physical Exam  BP 115/69 (BP Location: Left Arm)   Pulse (!) 120   Temp (!) 100.8 F (38.2 C) (Oral)   Resp 17   SpO2 100%   Breastfeeding No  Gen:   Awake, no distress   Resp:  Normal effort  MSK:   Moves extremities without difficulty  Other:  Tenderness palpation of right CVA.  Tenderness palpation of bilateral lower quadrant abdomen.  Patient appears uncomfortable.  Medical Decision Making  Medically screening exam initiated at 3:19 PM.  Appropriate orders placed.  Danetta Prom was informed that the remainder of the evaluation will be completed by another provider, this initial triage assessment does not replace that evaluation, and the importance of remaining in the ED until their evaluation is complete.  Labs, ct, and meds ordered   Alveria Apley, PA-C 10/24/20 1521    Cheryll Cockayne, MD 10/25/20 1108

## 2020-10-24 NOTE — ED Notes (Signed)
Pt stated she was leaving AMA 

## 2020-10-24 NOTE — ED Triage Notes (Signed)
Pt reports two weeks of R flank pain with radiation around to RLQ. Yesterday developed fever and n/v. Unable to tolerate PO intake. Reports foul smelling urine.

## 2020-10-28 ENCOUNTER — Other Ambulatory Visit: Payer: Self-pay

## 2020-12-22 ENCOUNTER — Other Ambulatory Visit: Payer: Self-pay

## 2020-12-22 ENCOUNTER — Ambulatory Visit (INDEPENDENT_AMBULATORY_CARE_PROVIDER_SITE_OTHER): Payer: Self-pay

## 2020-12-22 VITALS — BP 107/54 | HR 79 | Ht 61.0 in | Wt 122.4 lb

## 2020-12-22 DIAGNOSIS — Z01419 Encounter for gynecological examination (general) (routine) without abnormal findings: Secondary | ICD-10-CM

## 2020-12-22 DIAGNOSIS — Z3201 Encounter for pregnancy test, result positive: Secondary | ICD-10-CM

## 2020-12-22 DIAGNOSIS — Z3009 Encounter for other general counseling and advice on contraception: Secondary | ICD-10-CM

## 2020-12-22 DIAGNOSIS — Z3401 Encounter for supervision of normal first pregnancy, first trimester: Secondary | ICD-10-CM

## 2020-12-22 DIAGNOSIS — Z331 Pregnant state, incidental: Secondary | ICD-10-CM

## 2020-12-22 LAB — POCT PREGNANCY, URINE: Preg Test, Ur: POSITIVE — AB

## 2020-12-22 MED ORDER — NORETHIN ACE-ETH ESTRAD-FE 1-20 MG-MCG(24) PO TABS: 1.0000 | ORAL_TABLET | Freq: Every day | ORAL | 11 refills | Status: DC

## 2020-12-22 MED ORDER — NORETHIN ACE-ETH ESTRAD-FE 1-20 MG-MCG(24) PO TABS
1.0000 | ORAL_TABLET | Freq: Every day | ORAL | 11 refills | Status: DC
Start: 2020-12-22 — End: 2021-01-11

## 2020-12-22 NOTE — Progress Notes (Signed)
Subjective:     Kathryn Benjamin is a 25 y.o. female here at [facility] for a routine exam.  Current complaints: She is requesting contraceptive counseling. She is requesting Depo. Personal health questionnaire reviewed: yes.  Do you have a primary care provider? No Do you feel safe at home? Yes  Flowsheet Row [facility] in [facility]  PHQ-2 Total Score 0       Health Maintenance Due  Topic Date Due   HPV VACCINES (1 - 2-dose series) Never done   Hepatitis C Screening  Never done   TETANUS/TDAP  Never done   PAP-Cervical Cytology Screening  Never done   PAP SMEAR-Modifier  Never done   INFLUENZA VACCINE  Never done    Risk factors for chronic health problems: Smoking: No Alchohol/how much: No Illicit drug use: No Exercise: No Pt BMI: Body mass index is 23.13 kg/m.   Gynecologic History Patient's last menstrual period was 10/02/2020 (approximate). Contraception: none Sexual health: No issues Last Pap: Has never had Last mammogram: N/a  Obstetric History OB History  Gravida Para Term Preterm AB Living  3 1 1   2 1   SAB IAB Ectopic Multiple Live Births  2     0 1    # Outcome Date GA Lbr Len/2nd Weight Sex Delivery Anes PTL Lv  3 SAB 2022          2 SAB 2020     SAB     1 Term 12/31/12     Vag-Spont   LIV    The following portions of the patient's history were reviewed and updated as appropriate: allergies, current medications, past family history, past medical history, past social history, past surgical history, and problem list.  Review of Systems Pertinent items are noted in HPI.    Objective:   BP (!) 107/54   Pulse 79   Ht 5\' 1"  (1.549 m)   Wt 122 lb 6.4 oz (55.5 kg)   LMP 10/02/2020 (Approximate)   Breastfeeding No   BMI 23.13 kg/m  VS reviewed, nursing note reviewed,  Constitutional: well developed, well nourished, no distress HEENT: normocephalic CV: normal  rate Pulm/chest wall: normal effort Breast Exam: Deferred with low risks and shared decision making, discussed recommendation to start mammogram between 40-50 yo/ exam Abdomen: soft Neuro: alert and oriented x 3 Skin: warm, dry Psych: affect normal Pelvic exam: Deferred Bimanual exam: Deferred      Assessment/Plan:   1. Well woman exam with routine gynecological exam - Patient is self-pay, initially referral placed to [facility] for pap given patient has no insurance. Appointment scheduled for November 30th. Contacted [facility] as well to see if patient could be seen sooner for pap, but unable to make October appointments until next week. Patient to call next week to set up appointment for Depo. OCP's were discussed as a method to cover patient until she is able to get into [facility] for Depo, however UPT in office positive today. Patient surprised, but very happy. [facility] referral cancelled.  - Viability ultrasound ordered - Resources provided to patient, including applying for Pregnancy Medicaid. - Live Spanish interpretor used for today's visit  2. Birth control counseling - Today's UPT positive. Patient reports LMP was beginning of July. After further questioning, patient reports she did use Plan B approximately 2-3 weeks ago  3. Pregnancy test positive for incidental pregnancy - Given history of irregular periods, will schedule patient for  viability ultrasound - Patient to establish PNC   - US OB LESS THAN 14 WEEKS WITH OB TRANSVAGINAL; Future   Follow up for ultrasound. Establish PNC as soon as possible.    Brand Males, CNM 12/22/20 2:07 PM

## 2020-12-23 NOTE — Progress Notes (Signed)
Dating U/S scheduled for September 29th @ 1045 am.  Pt notified.   Kathryn Benjamin  12/23/20

## 2020-12-31 ENCOUNTER — Ambulatory Visit: Payer: Self-pay

## 2020-12-31 ENCOUNTER — Telehealth: Payer: Self-pay

## 2020-12-31 NOTE — Telephone Encounter (Signed)
Called parient via interpreter Natale Lay, [employer] to schedule pap appointment for patient. Left name and number for patient to call back.   Called patient also on 12/22/20. Left same message.

## 2021-01-07 ENCOUNTER — Other Ambulatory Visit: Payer: Self-pay

## 2021-01-07 ENCOUNTER — Ambulatory Visit
Admission: RE | Admit: 2021-01-07 | Discharge: 2021-01-07 | Disposition: A | Discharge status: Home / Self Care | Payer: Medicaid Other | Source: Ambulatory Visit

## 2021-01-07 ENCOUNTER — Telehealth: Payer: Self-pay

## 2021-01-07 ENCOUNTER — Telehealth: Payer: Self-pay | Admitting: Obstetrics and Gynecology

## 2021-01-07 DIAGNOSIS — Z331 Pregnant state, incidental: Secondary | ICD-10-CM

## 2021-01-07 NOTE — Telephone Encounter (Signed)
I called Kathryn Benjamin today at 4:05 PM and confirmed patient's identity using two patient identifiers. [place] results from earlier today were reviewed. First trimester warning signs reviewed. Patient voiced understanding and had no further questions.   US OB Comp Less 14 Wks  Result Date: 01/07/2021 CLINICAL DATA:  Pregnancy.  Assess viability EXAM: OBSTETRIC <14 WK ULTRASOUND TECHNIQUE: Transabdominal ultrasound was performed for evaluation of the gestation as well as the maternal uterus and adnexal regions. COMPARISON:  None. FINDINGS: Intrauterine gestational sac: Single Yolk sac:  Visualized. Embryo:  Visualized. Cardiac Activity: Visualized. Heart Rate: 173 bpm CRL:   19.3 mm   8 w 3 d                  [place] EDC: 08/16/2021 Subchorionic hemorrhage:  None visualized. Maternal uterus/adnexae: Unremarkable appearance of the bilateral ovaries. No free fluid within the pelvis. IMPRESSION: 1. Single live intrauterine gestation measuring 8 weeks 3 days by crown-rump length. 2. Active embryonic heart tones at 173 BPM. Electronically Signed   By: Duanne Guess D.O.   On: 01/07/2021 14:46    Kathryn Benjamin, Kathryn Rutherford, NP 01/07/2021 4:05 PM

## 2021-01-07 NOTE — Telephone Encounter (Addendum)
-----   Message from Marny Lowenstein, PA-C sent at 01/07/2021  3:31 PM EDT ----- Hi Everyone,   Can someone please grab a Spanish Interpreter and call this patient to inform her of normal [place] results from today? She is [redacted]w[redacted]d with normal FHR. She has been seen in our office before and can make an appointment to start prenatal care in ~ 4 weeks.   Thanks!  Raynelle Fanning    US OB Comp Less 14 Wks  Result Date: 01/07/2021 CLINICAL DATA:  Pregnancy.  Assess viability EXAM: OBSTETRIC <14 WK ULTRASOUND TECHNIQUE: Transabdominal ultrasound was performed for evaluation of the gestation as well as the maternal uterus and adnexal regions. COMPARISON:  None. FINDINGS: Intrauterine gestational sac: Single Yolk sac:  Visualized. Embryo:  Visualized. Cardiac Activity: Visualized. Heart Rate: 173 bpm CRL:   19.3 mm   8 w 3 d                  [place] EDC: 08/16/2021 Subchorionic hemorrhage:  None visualized. Maternal uterus/adnexae: Unremarkable appearance of the bilateral ovaries. No free fluid within the pelvis. IMPRESSION: 1. Single live intrauterine gestation measuring 8 weeks 3 days by crown-rump length. 2. Active embryonic heart tones at 173 BPM. Electronically Signed   By: Duanne Guess D.O.   On: 01/07/2021 14:46   --------------------------------------------------------------------------------------------------------------  Called pt with interpreter Eda. Unable to leave VM. Will call again tomorrow.

## 2021-01-08 NOTE — Telephone Encounter (Signed)
Call placed to pt with interpreter Raquel. Spoke with pt. Pt given results and recommendations per [place], [place].  Pt made new OB intake on 10/10 at 915am. Pt agreeable to date and time of appt.  Judeth Cornfield, RN

## 2021-01-11 ENCOUNTER — Telehealth (INDEPENDENT_AMBULATORY_CARE_PROVIDER_SITE_OTHER): Payer: Self-pay

## 2021-01-11 DIAGNOSIS — Z3491 Encounter for supervision of normal pregnancy, unspecified, first trimester: Secondary | ICD-10-CM

## 2021-01-11 DIAGNOSIS — Z3A00 Weeks of gestation of pregnancy not specified: Secondary | ICD-10-CM

## 2021-01-11 DIAGNOSIS — Z3490 Encounter for supervision of normal pregnancy, unspecified, unspecified trimester: Secondary | ICD-10-CM

## 2021-01-11 DIAGNOSIS — Z3493 Encounter for supervision of normal pregnancy, unspecified, third trimester: Secondary | ICD-10-CM | POA: Insufficient documentation

## 2021-01-11 NOTE — Progress Notes (Signed)
New OB Intake  I connected with  Kathryn Benjamin on 01/11/21 at  9:15 AM EDT by MyChart Video Visit and verified that I am speaking with the correct person using two identifiers. Nurse is located at [facility] and pt is located in personal vehicle, currently parked. Encounter completed with [employer] interpreter ID [id number].  I discussed the limitations, risks, security and privacy concerns of performing an evaluation and management service by telephone and the availability of in person appointments. I also discussed with the patient that there may be a patient responsible charge related to this service. The patient expressed understanding and agreed to proceed.  I explained I am completing New OB Intake today. We discussed her EDD of 08/16/21 that is based on [place] at [redacted]w[redacted]d. Pt is G4/P1. I reviewed her allergies, medications, Medical/Surgical/OB history, and appropriate screenings. I informed her of [facility] services. Based on history, this is a low risk pregnancy.  Patient Active Problem List   Diagnosis Date Noted   Supervision of low-risk pregnancy, first trimester 01/11/2021   Anembryonic pregnancy 09/16/2020   Delivery Plans:  Plans to deliver at [facility].   MyChart/Babyscripts MyChart access verified. Babyscripts instructions given and order placed.   Blood Pressure Cuff  Patient is self-pay; explained patient will be given BP cuff at first prenatal appt. Explained after first prenatal appt pt will check weekly and document in Babyscripts.  Anatomy US Explained first scheduled [place] will be around 19 weeks. Anatomy US scheduled for 03/22/21 at 0845. Pt notified to arrive at 0830.  Labs Discussed Avelina Laine genetic screening with patient. Would like both Panorama and Horizon drawn at new OB visit. Routine prenatal labs needed.  COVID Vaccine Patient has not had COVID vaccine.   Mother/ Baby Dyad Candidate?    No  Social Determinants of Health Food Insecurity: Patient expresses food insecurity.  Food Market information given to patient; explained patient may visit at the end of first OB appointment. WIC Referral: Patient is interested in referral to [facility].  Transportation: Patient denies transportation needs. Childcare: Discussed no children allowed at ultrasound appointments. Offered childcare services.  First visit review I reviewed new OB appt with pt. I explained she will have a provider visit with physical exam, PAP smear, and ob blood work. Explained pt will be seen by Nettie Elm, MD at first visit; encounter routed to appropriate provider. Explained that patient will be seen by pregnancy navigator following visit with provider.   Marjo Bicker, RN 01/11/2021  1:18 PM

## 2021-01-11 NOTE — Progress Notes (Signed)
Agree with nurses's documentation of this patient's clinic encounter.  Anyelina Claycomb L, MD  

## 2021-01-27 ENCOUNTER — Telehealth: Payer: Self-pay

## 2021-02-01 ENCOUNTER — Other Ambulatory Visit (HOSPITAL_COMMUNITY)
Admission: RE | Admit: 2021-02-01 | Discharge: 2021-02-01 | Disposition: A | Discharge status: Home / Self Care | Payer: Medicaid Other | Source: Ambulatory Visit | Attending: Obstetrics and Gynecology | Admitting: Obstetrics and Gynecology

## 2021-02-01 ENCOUNTER — Ambulatory Visit (INDEPENDENT_AMBULATORY_CARE_PROVIDER_SITE_OTHER): Payer: Self-pay | Admitting: Obstetrics and Gynecology

## 2021-02-01 ENCOUNTER — Other Ambulatory Visit: Payer: Self-pay

## 2021-02-01 ENCOUNTER — Encounter: Payer: Self-pay | Admitting: Obstetrics and Gynecology

## 2021-02-01 VITALS — BP 109/55 | HR 80 | Wt 122.1 lb

## 2021-02-01 DIAGNOSIS — Z3491 Encounter for supervision of normal pregnancy, unspecified, first trimester: Secondary | ICD-10-CM

## 2021-02-01 NOTE — Patient Instructions (Signed)
Primer trimestre de embarazo °First Trimester of Pregnancy °El primer trimestre de embarazo comienza el primer día de su último periodo menstrual hasta el final de la semana 12. También se dice que va desde el mes 1 hasta el mes 3 de embarazo. °Durante el primer trimestre, ocurren cambios en el cuerpo °Su organismo atraviesa por muchos cambios durante el embarazo. En general, los cambios vuelven a la normalidad después del nacimiento del bebé. °Cambios físicos °Usted puede aumentar o bajar de peso. °Las mamas pueden agrandarse y doler. La zona que rodea los pezones puede oscurecerse. °Pueden aparecer zonas oscuras o manchas en el rostro. °Tal vez haya cambios en el cabello. °Cambios en la salud °Es posible que se sienta como si fuera a vomitar (náuseas) y quizás vomite. °Es posible que tenga acidez estomacal. °Es posible que tenga dolores de cabeza. °Es posible que tenga dificultades para defecar (estreñimiento). °Las encías pueden sangrarle. °Otros cambios °Es posible que se canse con facilidad. °Es posible que haga pis (orine) con mayor frecuencia. °Los períodos menstruales se interrumpirán. °Es posible que no tenga hambre. °Es posible que quiera comer ciertos tipos de alimentos. °Puede tener cambios en sus emociones de un día para otro. °Es posible que tenga más sueños. °Siga estas instrucciones en su casa: °Medicamentos °Use los medicamentos de venta libre y los recetados solamente como se lo haya indicado el médico. Algunos medicamentos no son seguros durante el embarazo. °Tome vitaminas prenatales que contengan por lo menos 600 microgramos (mcg) de ácido fólico. °Comida y bebida °Consuma comidas saludables que incluyan lo siguiente: °Frutas y verduras frescas. °Cereales integrales. °Buenas fuentes de proteínas, como carne, huevos y tofu. °Productos lácteos con bajo contenido de grasa. °Evite la carne cruda y el jugo, la leche y el queso sin pasteurizar. °Si se siente como si fuera a vomitar: °Ingiera 4 o 5  comidas pequeñas por día en lugar de 3 abundantes. °Intente comer algunas galletitas saladas. °Beba líquidos entre las comidas, en lugar de hacerlo durante estas. °Es posible que deba tomar medidas para prevenir o tratar los problemas para defecar: °Beber suficiente líquido para mantener el pis (orina) de color amarillo pálido. °Come alimentos ricos en fibra. Entre ellos, frijoles, cereales integrales y frutas y verduras frescas. °Limitar los alimentos con alto contenido de grasa y azúcar. Estos incluyen alimentos fritos o dulces. °Actividad °Haga ejercicios solamente como se lo haya indicado el médico. La mayoría de las personas pueden realizar su rutina de ejercicios habitual durante el embarazo. °Deje de hacer ejercicios si tiene cólicos o dolor en la parte baja del vientre (abdomen) o en la cintura. °No haga ejercicio si hace demasiado calor, hay demasiada humedad o se encuentra en un lugar de mucha altura (altitud elevada). °Evite levantar pesos excesivos. °Si lo desea, puede continuar teniendo relaciones sexuales, a menos que el médico le indique lo contrario. °Alivio del dolor y del malestar °Use un sostén que le brinde buen soporte si le duelen las mamas. °Descanse con las piernas levantadas (elevadas) si tiene calambres en las piernas o dolor en la parte baja de la espalda. °Si tiene las venas de las piernas abultadas (venas varicosas): °Use medias de compresión según las indicaciones de su médico. °Levante los pies durante 15 minutos, 3 o 4 veces por día. °Limite la sal en sus alimentos. °Seguridad °Use el cinturón de seguridad en todo momento mientras vaya en auto. °Hable con el médico si alguien le está haciendo daño o gritando mucho. °Hable con el médico si se siente triste o   tiene pensamientos acerca de hacerse daño a usted misma. °Estilo de vida °No se dé baños de inmersión en agua caliente, baños turcos ni saunas. °No se haga duchas vaginales. No use tampones ni toallas higiénicas perfumadas. °No  consuma medicamentos a base de hierbas, drogas ilegales, ni medicamentos que el médico no haya autorizado. No beba alcohol. °No fume ni consuma ningún producto que contenga nicotina o tabaco. Si necesita ayuda para dejar de fumar, consulte al médico. °Evite el contacto con las bandejas sanitarias de los gatos y la tierra que estos animales usan. Estos contienen gérmenes que pueden dañar al bebé y causar la pérdida del bebé ya sea aborto espontáneo o muerte fetal. °Instrucciones generales °Cumpla con todas las visitas de seguimiento. Esto es importante. °Pida ayuda si necesita asesoramiento o asistencia con la alimentación. El médico puede aconsejarla o indicarle dónde recurrir para recibir ayuda. °Visite al dentista. En su casa, lávese los dientes con un cepillo dental suave. Pásese el hilo dental suavemente. °Escriba sus preguntas. Llévelas cuando concurra a las visitas prenatales. °Dónde buscar más información °American Pregnancy Association (Asociación Americana del Embarazo): americanpregnancy.org °American College of Obstetricians and Gynecologists (Colegio Estadounidense de Obstetras y Ginecólogos): www.acog.org °Office on Women's Health (Oficina para la Salud de la Mujer): womenshealth.gov/pregnancy °Comuníquese con un médico si: °Tiene mareos. °Tiene fiebre. °Tiene cólicos leves o siente presión en la parte baja del vientre. °Sufre un dolor persistente en el abdomen. °Sigue sintiendo como si fuera a vomitar, vomita o hace deposiciones acuosas (diarrea) durante 24 horas o más. °Advierte líquido con mal olor que proviene de la vagina. °Siente dolor al orinar. °Se expone a una enfermedad que se transmite de una persona a otra, como varicela, sarampión, virus de Zika, VIH o hepatitis. °Solicite ayuda de inmediato si: °Tiene sangrado o pequeñas pérdidas vaginales. °Tiene cólicos o dolor muy intensos en el vientre. °Le falta el aire o le duele el pecho. °Sufre cualquier tipo de lesión, por ejemplo, debido a una  caída o un accidente automovilístico. °Tiene dolor, hinchazón o enrojecimiento nuevos en un brazo o una pierna o se produce un aumento de alguno de estos síntomas. °Resumen °El primer trimestre del embarazo comienza el primer día de su último periodo menstrual hasta el final de la semana 12 (meses 1 al 3). °Ingiera 4 o 5 comidas pequeñas por día en lugar de 3 abundantes. °No fume ni consuma ningún producto que contenga nicotina o tabaco. Si necesita ayuda para dejar de fumar, consulte al médico. °Cumpla con todas las visitas de seguimiento. °Esta información no tiene como fin reemplazar el consejo del médico. Asegúrese de hacerle al médico cualquier pregunta que tenga. °Document Revised: 09/27/2019 Document Reviewed: 09/27/2019 °Elsevier Patient Education © 2022 Elsevier Inc. ° °

## 2021-02-01 NOTE — Progress Notes (Signed)
Subjective:  Kathryn Benjamin is a 25 y.o. [phone] at [redacted]w[redacted]d being seen today for ongoing prenatal care.  She is currently monitored for the following issues for this low-risk pregnancy and has Supervision of low-risk pregnancy, first trimester on their problem list.  Patient reports no complaints.  Contractions: Not present. Vag. Bleeding: None.  Movement: Absent. Denies leaking of fluid.   The following portions of the patient's history were reviewed and updated as appropriate: allergies, current medications, past family history, past medical history, past social history, past surgical history and problem list. Problem list updated.  Objective:   Vitals:   02/01/21 1523  BP: (!) 109/55  Pulse: 80  Weight: 122 lb 1.6 oz (55.4 kg)    Fetal Status: Fetal Heart Rate (bpm): 140   Movement: Absent     General:  Alert, oriented and cooperative. Patient is in no acute distress.  Skin: Skin is warm and dry. No rash noted.   Cardiovascular: Normal heart rate noted  Respiratory: Normal respiratory effort, no problems with respiration noted  Abdomen: Soft, gravid, appropriate for gestational age. Pain/Pressure: Absent     Pelvic:  Cervical exam deferred        Extremities: Normal range of motion.  Edema: None  Mental Status: Normal mood and affect. Normal behavior. Normal judgment and thought content.   Urinalysis:      Assessment and Plan:  Pregnancy: G4P1021 at [redacted]w[redacted]d  1. Supervision of low-risk pregnancy, first trimester Prenatal care and labs reviewed with pt Genetic testing reviewed PNV qd - CBC/D/Plt+RPR+Rh+ABO+RubIgG... - Genetic Screening - Culture, OB Urine - Cytology - PAP( [place])  Preterm labor symptoms and general obstetric precautions including but not limited to vaginal bleeding, contractions, leaking of fluid and fetal movement were reviewed in detail with the patient. Please refer to After Visit Summary for other counseling recommendations.  Return in about  4 weeks (around 03/01/2021) for OB visit, face to face, any provider.   Hermina Staggers, MD

## 2021-02-02 LAB — CYTOLOGY - PAP
Chlamydia: NEGATIVE
Comment: NEGATIVE
Comment: NORMAL
Diagnosis: NEGATIVE
Neisseria Gonorrhea: NEGATIVE

## 2021-02-03 LAB — CBC/D/PLT+RPR+RH+ABO+RUBIGG...
Antibody Screen: NEGATIVE
Basophils Absolute: 0.1 10*3/uL (ref 0.0–0.2)
Basos: 1 %
EOS (ABSOLUTE): 0.2 10*3/uL (ref 0.0–0.4)
Eos: 3 %
HCV Ab: <0.1 s/co ratio (ref 0.0–0.9)
HIV Screen 4th Generation wRfx: NONREACTIVE
Hematocrit: 33.6 % — ABNORMAL LOW (ref 34.0–46.6)
Hemoglobin: 11.8 g/dL (ref 11.1–15.9)
Hepatitis B Surface Ag: NEGATIVE
Immature Grans (Abs): 0 10*3/uL (ref 0.0–0.1)
Immature Granulocytes: 0 %
Lymphocytes Absolute: 1.5 10*3/uL (ref 0.7–3.1)
Lymphs: 17 %
MCH: 29.7 pg (ref 26.6–33.0)
MCHC: 35.1 g/dL (ref 31.5–35.7)
MCV: 85 fL (ref 79–97)
Monocytes Absolute: 0.5 10*3/uL (ref 0.1–0.9)
Monocytes: 6 %
Neutrophils Absolute: 6.2 10*3/uL (ref 1.4–7.0)
Neutrophils: 73 %
Platelets: 263 10*3/uL (ref 150–450)
RBC: 3.97 x10E6/uL (ref 3.77–5.28)
RDW: 12.8 % (ref 11.7–15.4)
RPR Ser Ql: NONREACTIVE
Rh Factor: POSITIVE
Rubella Antibodies, IGG: 7.43 index (ref >0.99)
WBC: 8.5 10*3/uL (ref 3.4–10.8)

## 2021-02-03 LAB — POCT URINALYSIS DIP (DEVICE)
Bilirubin Urine: NEGATIVE
Glucose, UA: NEGATIVE mg/dL
Ketones, ur: NEGATIVE mg/dL
Leukocytes,Ua: NEGATIVE
Nitrite: NEGATIVE
Protein, ur: NEGATIVE mg/dL
Specific Gravity, Urine: >=1.03 (ref 1.005–1.030)
Urobilinogen, UA: 0.2 mg/dL (ref 0.0–1.0)
pH: 6 (ref 5.0–8.0)

## 2021-02-03 LAB — HCV INTERPRETATION

## 2021-02-04 LAB — URINE CULTURE, OB REFLEX

## 2021-02-04 LAB — CULTURE, OB URINE

## 2021-03-02 ENCOUNTER — Other Ambulatory Visit: Payer: Self-pay

## 2021-03-02 ENCOUNTER — Ambulatory Visit (INDEPENDENT_AMBULATORY_CARE_PROVIDER_SITE_OTHER): Payer: Self-pay | Admitting: Obstetrics and Gynecology

## 2021-03-02 VITALS — BP 105/56 | HR 79 | Wt 123.2 lb

## 2021-03-02 DIAGNOSIS — Z5941 Food insecurity: Secondary | ICD-10-CM

## 2021-03-02 DIAGNOSIS — Z3491 Encounter for supervision of normal pregnancy, unspecified, first trimester: Secondary | ICD-10-CM

## 2021-03-02 DIAGNOSIS — Z8669 Personal history of other diseases of the nervous system and sense organs: Secondary | ICD-10-CM | POA: Insufficient documentation

## 2021-03-02 NOTE — Patient Instructions (Signed)
When you get home take 1,000 mg tylenol, 600 mg ibuprofen, and drink one cup of coffee.

## 2021-03-02 NOTE — Progress Notes (Signed)
   PRENATAL VISIT NOTE  Subjective:  Kathryn Benjamin is a 25 y.o. [phone] at [redacted]w[redacted]d being seen today for ongoing prenatal care.  She is currently monitored for the following issues for this low-risk pregnancy and has Supervision of low-risk pregnancy, first trimester and History of migraine headaches on their problem list.  Patient reports headache.  Contractions: Not present. Vag. Bleeding: None.  Movement: Absent. Denies leaking of fluid.  Has cut back caffeine and has had a posterior HA for a few days. She has not tried anything for the symptoms.   The following portions of the patient's history were reviewed and updated as appropriate: allergies, current medications, past family history, past medical history, past social history, past surgical history and problem list.   Objective:   Vitals:   03/02/21 0914  BP: (!) 105/56  Pulse: 79  Weight: 123 lb 3 oz (55.9 kg)    Fetal Status: Fetal Heart Rate (bpm): 145   Movement: Absent     General:  Alert, oriented and cooperative. Patient is in no acute distress.  Skin: Skin is warm and dry. No rash noted.   Cardiovascular: Normal heart rate noted  Respiratory: Normal respiratory effort, no problems with respiration noted  Abdomen: Soft, gravid, appropriate for gestational age.  Pain/Pressure: Present     Pelvic: Cervical exam deferred        Extremities: Normal range of motion.  Edema: None  Mental Status: Normal mood and affect. Normal behavior. Normal judgment and thought content.   Assessment and Plan:  Pregnancy: G4P1021 at [redacted]w[redacted]d 1. Food insecurity  - AMBULATORY REFERRAL TO [facility]  2. Supervision of low-risk pregnancy, first trimester  - Interpretor used - Try 1 gram of oral OTC tylenol, Ibuprofen 600 mg OTC, and a cup of coffee. If no improvement please let me know.  - AFP, Serum, Open Spina Bifida - Hemoglobin A1c - [place] is scheduled   Preterm labor symptoms and general obstetric precautions including  but not limited to vaginal bleeding, contractions, leaking of fluid and fetal movement were reviewed in detail with the patient. Please refer to After Visit Summary for other counseling recommendations.   Return in about 4 weeks (around 03/30/2021), or Low risk OB.  Future Appointments  Date Time Provider Department Center  03/22/2021  8:45 AM [facility] US5 [id number]  03/30/2021 10:35 AM Venora Maples, MD [facility]    Venia Carbon, NP

## 2021-03-04 LAB — AFP, SERUM, OPEN SPINA BIFIDA
AFP MoM: 0.95
AFP Value: 38.7 ng/mL
Gest. Age on Collection Date: 16.1 weeks
Maternal Age At EDD: 27.3 yr
OSBR Risk 1 IN: 10000
Test Results:: NEGATIVE
Weight: 123 [lb_av]

## 2021-03-04 LAB — HEMOGLOBIN A1C
Est. average glucose Bld gHb Est-mCnc: 97 mg/dL
Hgb A1c MFr Bld: 5 % (ref 4.8–5.6)

## 2021-03-22 ENCOUNTER — Other Ambulatory Visit: Payer: Self-pay

## 2021-03-22 ENCOUNTER — Ambulatory Visit: Payer: Medicaid Other | Attending: Obstetrics and Gynecology

## 2021-03-22 ENCOUNTER — Other Ambulatory Visit: Payer: Self-pay | Admitting: Obstetrics and Gynecology

## 2021-03-22 DIAGNOSIS — O358XX0 Maternal care for other (suspected) fetal abnormality and damage, not applicable or unspecified: Secondary | ICD-10-CM | POA: Diagnosis not present

## 2021-03-22 DIAGNOSIS — Z3A19 19 weeks gestation of pregnancy: Secondary | ICD-10-CM | POA: Insufficient documentation

## 2021-03-22 DIAGNOSIS — Z3491 Encounter for supervision of normal pregnancy, unspecified, first trimester: Secondary | ICD-10-CM | POA: Diagnosis present

## 2021-03-22 DIAGNOSIS — Z363 Encounter for antenatal screening for malformations: Secondary | ICD-10-CM | POA: Diagnosis not present

## 2021-03-30 ENCOUNTER — Encounter: Payer: Self-pay | Admitting: Family Medicine

## 2021-03-30 ENCOUNTER — Ambulatory Visit (INDEPENDENT_AMBULATORY_CARE_PROVIDER_SITE_OTHER): Payer: Self-pay | Admitting: Family Medicine

## 2021-03-30 ENCOUNTER — Other Ambulatory Visit: Payer: Self-pay

## 2021-03-30 VITALS — BP 101/64 | HR 89 | Wt 124.2 lb

## 2021-03-30 DIAGNOSIS — Z8669 Personal history of other diseases of the nervous system and sense organs: Secondary | ICD-10-CM

## 2021-03-30 DIAGNOSIS — Z3491 Encounter for supervision of normal pregnancy, unspecified, first trimester: Secondary | ICD-10-CM

## 2021-03-30 MED ORDER — FLUTICASONE PROPIONATE 50 MCG/ACT NA SUSP: 1.0000 | Freq: Every day | NASAL | 2 refills | Status: DC

## 2021-03-30 NOTE — Progress Notes (Signed)
° °  Subjective:  Janille Draughon is a 25 y.o. [phone] at [redacted]w[redacted]d being seen today for ongoing prenatal care.  She is currently monitored for the following issues for this low-risk pregnancy and has Supervision of low-risk pregnancy, first trimester and History of migraine headaches on their problem list.  Patient reports no complaints.  Contractions: Not present. Vag. Bleeding: None.  Movement: Present. Denies leaking of fluid.   The following portions of the patient's history were reviewed and updated as appropriate: allergies, current medications, past family history, past medical history, past social history, past surgical history and problem list. Problem list updated.  Objective:   Vitals:   03/30/21 1046  BP: 101/64  Pulse: 89  Weight: 124 lb 3.2 oz (56.3 kg)    Fetal Status: Fetal Heart Rate (bpm): 147   Movement: Present     General:  Alert, oriented and cooperative. Patient is in no acute distress.  Skin: Skin is warm and dry. No rash noted.   Cardiovascular: Normal heart rate noted  Respiratory: Normal respiratory effort, no problems with respiration noted  Abdomen: Soft, gravid, appropriate for gestational age. Pain/Pressure: Present     Pelvic: Vag. Bleeding: None     Cervical exam deferred        Extremities: Normal range of motion.  Edema: Trace  Mental Status: Normal mood and affect. Normal behavior. Normal judgment and thought content.   Urinalysis:      Assessment and Plan:  Pregnancy: G4P1021 at [redacted]w[redacted]d  1. Supervision of low-risk pregnancy, first trimester BP and FHR normal Having some nosebleeds and congestion, discussed humidifier in bedroom, flonase if not effective  2. History of migraine headaches None recently  Preterm labor symptoms and general obstetric precautions including but not limited to vaginal bleeding, contractions, leaking of fluid and fetal movement were reviewed in detail with the patient. Please refer to After Visit Summary for  other counseling recommendations.  Return in 4 weeks (on 04/27/2021) for [facility], ob visit.   Venora Maples, MD

## 2021-03-30 NOTE — Patient Instructions (Signed)
Eleccin del mtodo anticonceptivo Contraception Choices La anticoncepcin, o los mtodos anticonceptivos, hace referencia a los mtodos o dispositivos que evitan el embarazo. Mtodos hormonales Implante anticonceptivo Un implante anticonceptivo consiste en un tubo delgado de plstico que contiene una hormona que evita el embarazo. Es diferente de un dispositivo intrauterino (DIU). Un mdico lo inserta en la parte superior del brazo. Los implantes pueden ser eficaces durante un mximo de 3 aos. Inyecciones de progestina sola Las inyecciones de progestina sola contienen progestina, una forma sinttica de la hormona progesterona. Un mdico las administra cada 3 meses. Pldoras anticonceptivas Las pldoras anticonceptivas son pastillas que contienen hormonas que evitan el embarazo. Deben tomarse una vez al da, preferentemente a la misma hora cada da. Se necesita una receta para utilizar este mtodo anticonceptivo. Parche anticonceptivo El parche anticonceptivo contiene hormonas que evitan el embarazo. Se coloca en la piel, debe cambiarse una vez a la semana durante tres semanas y debe retirarse en la cuarta semana. Se necesita una receta para utilizar este mtodo anticonceptivo. Anillo vaginal Un anillo vaginal contiene hormonas que evitan el embarazo. Se coloca en la vagina durante tres semanas y se retira en la cuarta semana. Luego se repite el proceso con un anillo nuevo. Se necesita una receta para utilizar este mtodo anticonceptivo. Anticonceptivo de emergencia Los anticonceptivos de emergencia son mtodos para evitar un embarazo despus de tener sexo sin proteccin. Vienen en forma de pldora y pueden tomarse hasta 5 das despus de tener sexo. Funcionan mejor cuando se toman lo ms pronto posible luego de tener sexo. La mayora de los anticonceptivos de emergencia estn disponibles sin receta mdica. Este mtodo no debe utilizarse como el nico mtodo anticonceptivo. Mtodos de  barrera Condn masculino Un condn masculino es una vaina delgada que se coloca sobre el pene durante el sexo. Los condones evitan que el esperma ingrese en el cuerpo de la mujer. Pueden utilizarse con un una sustancia que mata a los espermatozoides (espermicida) para aumentar la efectividad. Deben desecharse despus de un uso. Condn femenino Un condn femenino es una vaina blanda y holgada que se coloca en la vagina antes de tener sexo. El condn evita que el esperma ingrese en el cuerpo de la mujer. Deben desecharse despus de un uso. Diafragma Un diafragma es una barrera blanda con forma de cpula. Se inserta en la vagina antes del sexo, junto con un espermicida. El diafragma bloquea el ingreso de esperma en el tero, y el espermicida mata a los espermatozoides. El diafragma debe permanecer en la vagina durante 6 a 8 horas despus de tener sexo y debe retirarse en el plazo de las 24 horas. Un diafragma es recetado y colocado por un mdico. Debe reemplazarse cada 1 a 2 aos, despus de dar a luz, de aumentar ms de 15lb (6.8kg) y de una ciruga plvica. Capuchn cervical Un capuchn cervical es una copa redonda y blanda de ltex o plstico que se coloca en el cuello uterino. Se inserta en la vagina antes del sexo, junto con un espermicida. Bloquea el ingreso del esperma en el tero. El capuchn debe permanecer en el lugar durante 6 a 8 horas despus de tener sexo y debe retirarse en el plazo de las 48 horas. Un capuchn cervical debe ser recetado y colocado por un mdico. Debe reemplazarse cada 2aos. Esponja Una esponja es una pieza blanda y circular de espuma de poliuretano que contiene espermicida. La esponja ayuda a bloquear el ingreso de esperma en el tero, y el espermicida mata a   los espermatozoides. Para utilizarla, debe humedecerla e insertarla en la vagina. Debe insertarse antes de tener sexo, debe permanecer dentro al menos durante 6 horas despus de tener sexo y debe retirarse y  desecharse en el plazo de las 30 horas. Espermicidas Los espermicidas son sustancias qumicas que matan o bloquean al esperma y no lo dejan ingresar al cuello uterino y al tero. Vienen en forma de crema, gel, supositorio, espuma o comprimido. Un espermicida debe insertarse en la vagina con un aplicador al menos 10 o 15 minutos antes de tener sexo para dar tiempo a que surta efecto. El proceso debe repetirse cada vez que tenga sexo. Los espermicidas no requieren receta mdica. Anticonceptivos intrauterinos Dispositivo intrauterino (DIU) Un DIU es un dispositivo en forma de T que se coloca en el tero. Existen dos tipos: DIU hormonal.Este tipo contiene progestina, una forma sinttica de la hormona progesterona. Este tipo puede permanecer colocado durante 3 a 5 aos. DIU de cobre.Este tipo est recubierto con un alambre de cobre. Puede permanecer colocado durante 10 aos. Mtodos anticonceptivos permanentes Ligadura de trompas en la mujer En este mtodo, se sellan, atan u obstruyen las trompas de Falopio durante una ciruga para evitar que el vulo descienda hacia el tero. Esterilizacin histeroscpica En este mtodo, se coloca un implante pequeo y flexible dentro de cada trompa de Falopio. Los implantes hacen que se forme un tejido cicatricial en las trompas de Falopio y que las obstruya para que el espermatozoide no pueda llegar al vulo. El procedimiento demora alrededor de 3 meses para que sea efectivo. Debe utilizarse otro mtodo anticonceptivo durante esos 3 meses. Esterilizacin masculina Este es un procedimiento que consiste en atar los conductos que transportan el esperma (vasectoma). Luego del procedimiento, el hombre puede eyacular lquido (semen). Debe utilizarse otro mtodo anticonceptivo durante 3 meses despus del procedimiento. Mtodos de planificacin natural Planificacin familiar natural En este mtodo, la pareja no tiene sexo durante los das en que la mujer podra quedar  embarazada. Mtodo calendario En este mtodo, la mujer realiza un seguimiento de la duracin de cada ciclo menstrual, identifica los das en los que se puede producir un embarazo y no tiene sexo durante esos das. Mtodo de la ovulacin En este mtodo, la pareja evita tener sexo durante la ovulacin. Mtodo sintotrmico Este mtodo implica no tener sexo durante la ovulacin. Normalmente, la mujer comprueba la ovulacin al observar cambios en su temperatura y en la consistencia del moco cervical. Mtodo posovulacin En este mtodo, la pareja espera a que finalice la ovulacin para tener sexo. Dnde buscar ms informacin Centers for Disease Control and Prevention (Centros para el Control y la Prevencin de Enfermedades): www.cdc.gov Resumen La anticoncepcin, o los mtodos anticonceptivos, hace referencia a los mtodos o dispositivos que evitan el embarazo. Los mtodos anticonceptivos hormonales incluyen implantes, inyecciones, pastillas, parches, anillos vaginales y anticonceptivos de emergencia. Los mtodos anticonceptivos de barrera pueden incluir condones masculinos, condones femeninos, diafragmas, capuchones cervicales, esponjas y espermicidas. Existen dos tipos de DIU (dispositivo intrauterino). Un DIU puede colocarse en el tero de una mujer para evitar el embarazo durante 3 a 5 aos. La esterilizacin permanente puede realizarse mediante un procedimiento tanto en los hombres como en las mujeres. Los mtodos de planificacin familiar natural implican no tener sexo durante los das en que la mujer podra quedar embarazada. Esta informacin no tiene como fin reemplazar el consejo del mdico. Asegrese de hacerle al mdico cualquier pregunta que tenga. Document Revised: 10/22/2019 Document Reviewed: 10/22/2019 Elsevier Patient Education  2022 Elsevier   Inc.  

## 2021-04-04 NOTE — L&D Delivery Note (Signed)
OB/GYN Faculty Practice Delivery Note ? ?Kathryn Benjamin is a 26 y.o. [id number] s/p VD at [redacted]w[redacted]d. She was admitted for IOL d/t DFM.  ? ?ROM: 0h 72m with clear fluid ?GBS Status: Negative/-- (04/19 1315) ?Maximum Maternal Temperature: 98.77F ? ?Labor Progress: ?Initial SVE: 2/60/-3. She was induced with FB and pit. After the FB fell out and pit was increased, she quickly progressed to complete with subsequent SROM shortly before delivery.  ? ?Delivery Date/Time: [id number]  ?Delivery: Called to room and patient was complete and pushing. Head delivered L OA. Nuchal cord present x2 with body cord as well. Shoulder and body delivered in usual fashion. Infant initially with poor tone, placed on mother's abdomen, dried and stimulated with cry shortly afterwards. Cord clamped x 2 after 1-minute delay, and cut by RN. Cord blood drawn. Placenta delivered spontaneously with gentle cord traction. Fundus firm with massage, lower uterine sweep, and Pitocin. Labia, perineum, vagina, and cervix inspected with a small right labial abrasion that was hemostatic and not repaired.  ?Baby Weight: pending ? ?Placenta: 3 vessel, intact. Sent to L&D ?Complications: None ?Lacerations: small right labial abrasion  ?EBL: 75 mL ?Analgesia: Epidural  ? ?Infant:  ?APGAR (1 MIN): 8 ?APGAR (5 MINS): 9 ? ?Leticia Penna, DO  ?[facility] Family Medicine Fellow, Faculty Practice ?Center for [employer], [facility] ?08/11/2021, 5:04 AM ? ? ? ?  ?

## 2021-04-07 ENCOUNTER — Telehealth: Payer: Self-pay | Admitting: General Practice

## 2021-04-07 ENCOUNTER — Encounter: Payer: Self-pay | Admitting: Family Medicine

## 2021-04-07 NOTE — Telephone Encounter (Signed)
Called patient regarding mychart message with Eda assisting with spanish interpretation. Patient states she vomited 4 times this morning between 8-12 which is when she was feeling weak. Patient states she has just felt dizzy since then with a slight headache. BP 105/60. She also reports abdominal pain rated at a 3. Asked patient about PO intake today and she states she has been able to eat breakfast & lunch today as well as drink 2 bottles of water. Discussed with patient dizziness and abdominal pain is likely due to vomiting this morning and mild dehydration. Recommended eating dinner today plus a snack and provided BRAT guidelines. Also recommended drinking at least 3 more bottles of water and take extra strength tylenol. Advised she call back in the morning if she isn't feeling better. Patient verbalized understanding.

## 2021-04-27 ENCOUNTER — Other Ambulatory Visit: Payer: Self-pay

## 2021-04-27 ENCOUNTER — Encounter: Payer: Self-pay | Admitting: Family Medicine

## 2021-04-27 ENCOUNTER — Ambulatory Visit (INDEPENDENT_AMBULATORY_CARE_PROVIDER_SITE_OTHER): Payer: Self-pay | Admitting: Family Medicine

## 2021-04-27 VITALS — Wt 130.8 lb

## 2021-04-27 DIAGNOSIS — Z3491 Encounter for supervision of normal pregnancy, unspecified, first trimester: Secondary | ICD-10-CM

## 2021-04-27 DIAGNOSIS — Z8669 Personal history of other diseases of the nervous system and sense organs: Secondary | ICD-10-CM

## 2021-04-27 NOTE — Progress Notes (Signed)
° °  Subjective:  Kathryn Benjamin is a 26 y.o. [phone] at [redacted]w[redacted]d being seen today for ongoing prenatal care.  She is currently monitored for the following issues for this low-risk pregnancy and has Supervision of low-risk pregnancy, first trimester and History of migraine headaches on their problem list.  Patient reports no complaints.  Contractions: Not present. Vag. Bleeding: None.  Movement: Present. Denies leaking of fluid.   The following portions of the patient's history were reviewed and updated as appropriate: allergies, current medicatio ns, past family history, past medical history, past social history, past surgical history and problem list. Problem list updated.  Objective:   Vitals:   04/27/21 1503  Weight: 130 lb 12.8 oz (59.3 kg)    Fetal Status: Fetal Heart Rate (bpm): 145   Movement: Present     General:  Alert, oriented and cooperative. Patient is in no acute distress.  Skin: Skin is warm and dry. No rash noted.   Cardiovascular: Normal heart rate noted  Respiratory: Normal respiratory effort, no problems with respiration noted  Abdomen: Soft, gravid, appropriate for gestational age. Pain/Pressure: Present     Pelvic: Vag. Bleeding: None     Cervical exam deferred        Extremities: Normal range of motion.  Edema: None  Mental Status: Normal mood and affect. Normal behavior. Normal judgment and thought content.   Urinalysis:      Assessment and Plan:  Pregnancy: G4P1021 at [redacted]w[redacted]d  1. Supervision of low-risk pregnancy, first trimester BP and FHR normal Discussed need to come fasting to next visit   Preterm labor symptoms and general obstetric precautions including but not limited to vaginal bleeding, contractions, leaking of fluid and fetal movement were reviewed in detail with the patient. Please refer to After Visit Summary for other counseling recommendations.  Return in about 4 weeks (around 05/25/2021) for ob visit, 28 wk labs, [facility].   Venora Maples, MD

## 2021-05-03 ENCOUNTER — Encounter: Payer: Self-pay | Admitting: Family Medicine

## 2021-05-25 ENCOUNTER — Other Ambulatory Visit: Payer: Self-pay

## 2021-05-25 ENCOUNTER — Ambulatory Visit (INDEPENDENT_AMBULATORY_CARE_PROVIDER_SITE_OTHER): Payer: Self-pay | Admitting: Nurse Practitioner

## 2021-05-25 VITALS — BP 107/59 | HR 87 | Wt 131.0 lb

## 2021-05-25 DIAGNOSIS — Z3491 Encounter for supervision of normal pregnancy, unspecified, first trimester: Secondary | ICD-10-CM

## 2021-05-25 DIAGNOSIS — Z23 Encounter for immunization: Secondary | ICD-10-CM

## 2021-05-25 DIAGNOSIS — Z3A28 28 weeks gestation of pregnancy: Secondary | ICD-10-CM

## 2021-05-25 DIAGNOSIS — K5901 Slow transit constipation: Secondary | ICD-10-CM

## 2021-05-25 DIAGNOSIS — Z8669 Personal history of other diseases of the nervous system and sense organs: Secondary | ICD-10-CM

## 2021-05-25 MED ORDER — DOCUSATE CALCIUM 240 MG PO CAPS: 240.0000 mg | ORAL_CAPSULE | Freq: Every day | ORAL | 2 refills | Status: DC

## 2021-05-25 NOTE — Progress Notes (Signed)
° ° °  Subjective:  Kathryn Benjamin is a 26 y.o. [phone] at [redacted]w[redacted]d being seen today for ongoing prenatal care.  She is currently monitored for the following issues for this low-risk pregnancy and has Supervision of low-risk pregnancy, first trimester and History of migraine headaches on their problem list.  Patient reports  constipation from taking iron supplement .  Contractions: Irritability. Vag. Bleeding: None.  Movement: Present. Denies leaking of fluid.   The following portions of the patient's history were reviewed and updated as appropriate: allergies, current medications, past family history, past medical history, past social history, past surgical history and problem list. Problem list updated.  Objective:   Vitals:   05/25/21 0828  BP: (!) 107/59  Pulse: 87  Weight: 131 lb (59.4 kg)    Fetal Status: Fetal Heart Rate (bpm): 140   Movement: Present     General:  Alert, oriented and cooperative. Patient is in no acute distress.  Skin: Skin is warm and dry. No rash noted.   Cardiovascular: Normal heart rate noted  Respiratory: Normal respiratory effort, no problems with respiration noted  Abdomen: Soft, gravid, appropriate for gestational age. Pain/Pressure: Present     Pelvic:  Cervical exam deferred        Extremities: Normal range of motion.  Edema: None  Mental Status: Normal mood and affect. Normal behavior. Normal judgment and thought content.   Urinalysis:      Assessment and Plan:  Pregnancy: G4P1021 at [redacted]w[redacted]d  1. Supervision of low-risk pregnancy, first trimester Has medicaid now Went to [facility] today Having glucola testing today  - Tdap vaccine greater than or equal to 7yo IM  2. Slow transit constipation See AVS for info Prescribed Surfak  3. History of migraine headaches Taking tylenol and not having too much problem  4. [redacted] weeks gestation of pregnancy   Preterm labor symptoms and general obstetric precautions including but not limited to  vaginal bleeding, contractions, leaking of fluid and fetal movement were reviewed in detail with the patient. Please refer to After Visit Summary for other counseling recommendations.  Return in about 2 weeks (around 06/08/2021) for ROB in person.  Earlie Server, RN, MSN, NP-BC Nurse Practitioner, [facility] for [employer], [place] [facility] 05/25/2021 8:59 AM

## 2021-05-25 NOTE — Patient Instructions (Signed)
AREA PEDIATRIC/FAMILY PRACTICE PHYSICIANS  [place] ([address]) [facility] Chambliss, MD; Eniola, MD; Hale, MD; Hensel, MD; McDiarmid, MD; McIntyer, MD; Neal, MD; Walden, MD [address] [place], [place] [address] [phone] Mon-Fri 8:30-12:30, 1:30-5:00 Providers come to see babies at [facility] Accepting Medicaid [facility] providers who accept newborns: Koirala, MD; Morrow, MD; Wolters, MD [address], [place], [place] [address] [phone] Mon-Fri 8:00-5:30 Babies seen by providers at [facility] Does NOT accept Medicaid Please call early in hospitalization for appointment (limited availability)  [facility] Mulberry, MD [address] [place], [place] [address] [phone] Mon, Tue, Thur, Fri 8:30-5:00, Wed 10:00-7:00 (closed 1-2pm) Babies seen by [facility] providers Accepting Medicaid Rubin - Pediatrician Rubin, MD [address], [place], [place] [address] [phone] Mon-Fri 8:30-5:00, Sat 8:30-12:00 Provider comes to see babies at [facility] Accepting Medicaid Must have been referred from current patients or contacted office prior to delivery [facility] ([facility]) Brown, MD; Chandler, MD; Ettefagh, MD; Grant, MD; Lester, MD; McCormick, MD; McQueen, MD; Prose, MD; Simha, MD; Stanley, MD; Stryffeler, NP; Tebben, NP [address], [place], [place] [address] [phone] Mon, Tue, Thur, Fri 8:30-5:30, Wed 9:30-5:30, Sat 8:30-12:30 Babies seen by [facility] providers Accepting Medicaid Only accepting infants of first-time parents or siblings of current patients Hospital discharge coordinator will make follow-up appointment Jack Amos [address], [place], [place]  [address] [phone]   Fax - [phone] [facility] [address], [place], [place]  [address] Phone - [phone]   Fax - [phone] Shilpa Gosrani [address], [place], [place]  [address] [phone]  [place] ([address]) [facility] Bates, MD; Brassfield, MD; Cooper, Cox, MD; MD; Davis, MD; Dovico, MD; Ettefaugh, MD; Little, MD; Lowe, MD; Keiffer, MD; Melvin, MD; Sumner, MD; Williams, MD [address], [place], [place] [address] [phone] Mon-Fri 8:30-5:00 (extended evenings Mon-Thur as needed), Sat-Sun 10:00-1:00 Providers come to see babies at [facility] Accepting Medicaid for families of first-time babies and families with all children in the household age 3 and under. Must register with office prior to making appointment (M-F only). [facility] Henson, NP; Knapp, MD; Lalonde, MD; Tysinger, PA [address] [place], [place] [address] [phone] Mon-Fri 8:00-5:00 Babies seen by providers at [facility] Does NOT accept Medicaid/Commercial Insurance Only [facility] ([facility])  Artis, MD; Barnes, MD; Bratton, MD; Coccaro, MD; Lockett Gardner, MD; Kramer, MD; Marshall, MD; Netherton, MD; Poleto, MD; Skinner, MD [address] [place], [place] [address] [phone] Mon-Fri 8:30-5:30, Sat (Oct.-Mar.) 9:00-1:00 Babies seen by providers at [facility] Accepting Medicaid  [place] ([address]) [facility] Reid, MD; Warner, MD [address], [place], [place] [address] [phone] Mon-Fri 8:30-5:00, Sat 8:30-12:00 Providers come to see babies at [facility] Does NOT accept Medicaid [facility] Becker, PA; Hagler, MD; Scifres, PA; Sun, MD; Swayne, MD [address], [place], [place] [address] [phone] Mon-Fri 8:00-5:00 Babies seen by providers at [facility] Does NOT accept Medicaid Only accepting babies of parents who  are patients Please call early in hospitalization for appointment (limited availability) [facility] Clark, MD; Frye, MD; Kelleher, MD; Mack, NP; Miller, MD; O'Keller, MD; Patterson, NP; Pudlo, MD; Puzio, MD; Thomas, MD; Tucker, MD; Twiselton, MD [address], [place], [place] [address] [phone] Mon-Fri 8:00-5:00, Sat 9:00-12:00 Providers come to see babies at [facility] Does NOT accept Medicaid  [place] ([address]) [facility] providers accepting new patients: Brake, NP; Wharton, PA [address], [place], [place] [address] [phone] Mon-Fri 8:00-5:00 Babies seen by providers at [facility] Does NOT accept Medicaid Only accepting babies of parents who are patients Please call early in hospitalization for appointment (limited availability) [facility] Gay, MD; Quinlan, MD [address] [place], [place] [address] [phone] (press 1 to schedule appointment) Mon-Fri 8:00-5:00 Providers come to see babies at [facility] Does NOT accept Medicaid [facility] Mazer, MD [address] [place], [place] [address] [phone] Mon-Fri 8:30-5:00 (lunch 12:30-1:00), extended hours by appointment only Wed 5:00-6:30 Babies seen by [facility] providers Accepting Medicaid [facility] Banks, MD; Jordan, MD; Koberlein, MD [address], [place], [place] [address] [phone] Mon-Fri 8:00-5:00 Babies seen by [facility] providers Does NOT accept Medicaid [facility] Parker, MD; Hunter, MD; Wallace, DO [address] [place], [place] [address] [phone] Mon-Fri 8:00-5:00 Babies seen by [facility] providers Does NOT accept Medicaid [facility] Brandon, PA; Brecken, PA; Christy, NP; Dees, MD; DeClaire, MD; DeWeese, MD; Hansen, NP; Mills, NP; Parrish, NP; Smoot, NP; Summer, MD; Vapne,  MD [address] [place], [place] [address] [phone] Mon-Fri 8:30-5:00, Sat 10:00-1:00 Providers come to see babies at [facility] Does NOT accept Medicaid Free prenatal information session Tuesdays at 4:45pm [facility] Bouska, MD; [place], PA; Jeffery, PA; Weber, PA [address] [place] [address] [phone] Mon-Fri 7:30-5:30 Babies seen by [facility] providers [facility] [address], [place], [place]  [address] [phone]   Fax - [phone]  [place] ([address] & [address]) [facility] [place] [address] [place], [place] [address] [phone] Mon-Thur 8:00-6:00 Providers come to see babies at [facility] Accepting Medicaid [facility] Anderson, NP; Badger, MD; Beal, PA; Spencer, PA [address] [place], [place] [address] [phone] Mon-Thur 7:30-7:30, Fri 7:30-4:30 Babies seen by [facility] providers Accepting Medicaid [facility] Agbuya, MD; Klett, NP; Romgoolam, MD [address], [place], [place] [address] [phone] Mon-Fri 8:30-5:00, Sat 8:30-12:00 Providers come to see babies at [facility] Accepting Medicaid Must have "Meet & Greet" appointment at office prior to delivery [facility]) McCord, MD; Wallace, MD; Wood, MD [address], [place], [place] [address] [phone] Mon-Wed 8:00-6:00, Thur-Fri 8:00-5:00, Sat 9:00-12:00 Providers come to see babies at [facility] Does NOT accept Medicaid Only accepting siblings of current patients [facility]  [address], [place], [place]  [address] [phone]   Fax - [phone] [facility] [address], [place], [place]  [address] [phone]   Fax -  [phone]  [place] ([address] & [address]) [facility] Cirigliano, DO; Matthews, DO [address] , [place] [address] [phone] Mon-Fri 7:00-5:00 Babies seen by [facility] providers Does NOT accept Medicaid [facility] Briscoe, MD; Howley, PA; Moreira, PA [address], [place], [place] [address] [phone] Mon-Fri 8:00-5:00 Babies seen by [facility] providers Accepting Medicaid [facility] Boyd, MD; Church, PA; Jones, NP; Osborn, PA [address], , [place] [address] [phone] Mon-Fri 8:00-5:00 Babies seen by providers at [facility] Accepting Medicaid  [place] ([address]) [facility] [place] Wendling, DO [address] [place], [place] [address] [phone] Mon-Fri 8:00-5:00 Babies seen by [facility] providers Does NOT accept Medicaid Limited availability, please call early in hospitalization to schedule follow-up [facility] Calderon, PA; Cummings, MD; Dillard, MD; Martin, PA; Olson, MD; VanDeven, PA [address], [place], [place] [address] [phone] Mon-Fri 8:30-5:00, Sat 9:00-12:00 Babies seen by providers at [facility] Accepting Medicaid Please register online then schedule online or call office [email or web address]) Hunter, NP; Kumar, MD; Martin Rogers, PA [address], [place], [place] [address] [phone] Mon-Fri 8:00-5:00 Babies seen by providers at [facility] Accepting Medicaid [facility] ([facility]) Rio Pinar, MD; Kristi Fleenor, NP; West, MD [address], [place], [place] [address] [phone] Mon-Fri 8:00-5:30, Sat&Sun by appointment (phones open at  8:30) Babies seen by [facility] providers Accepting Medicaid Must be a first-time baby or sibling of current patient [facility] [place]  [address], [place], [place]  [address] [phone]   Fax - [phone]  [place] ([address]) [facility] Brown, PA; Cowen, PA; Rice, MD; Helton, PA; Spry, MD [address] [place], [place] [address] [phone] Mon-Thur 8:00-7:00, Fri 8:00-5:00, Sat 8:00-12:00, Sun 9:00-12:00 Babies seen by [facility] providers Accepting Medicaid [facility] [place] Coe-Goins, MD; Marshall, MD; Pierre-Louis, MD [address] [place] [address], [place], [place] [address] [phone] Mon-Thur 8:00-5:00 Babies seen by providers at [facility] Accepting Medicaid [facility] Bratton, MD; Coe-Goins, MD; Hayes, MD; Lewis, MD; List, MD; Lott, MD; Marshall, MD; Moran, MD; O'Neal, MD; Pierre-Louis, MD; Pitonzo, MD; Scholer, MD; Spangle, MD [address] [place], [place] [address] [phone] Mon-Fri 8:00-5:30, Sat (Oct.-Mar.) 9:00-1:00 Babies seen by providers at [facility] Accepting Medicaid Must fill out new patient packet, available online at [email or web address] [facility] ([facility]) Friddle, NP; Harris, NP; Kelly, NP; Logan, MD; Melvin, PA; Poth, MD; Ramadoss, MD; Stanton, NP [address], [place], [place] [address] [phone] Mon-Thur 8:00-5:30, Fri 8:00-5:00 Babies seen by providers at [facility] Accepting Medicaid  [place] ([address]) [facility] Dixon, PA; [place], MD; Pickard, MD; Tapia, PA [address], [place], [place] [address] [phone] Mon-Fri 8:00-5:00 Babies seen by providers at [facility] Accepting Medicaid   [place] ([address]) [facility] [place] Masneri, DO; Meyers, MD; Nelson, PA [address], [place], [place] [address] [phone] Mon-Fri 8:00-5:00 Babies seen by providers at [facility] Does NOT accept Medicaid Limited appointment availability, please call early in hospitalization  [facility] [place] Kunedd, DO; McGowen, MD [address], [place], [place] [address] [phone] Mon-Fri 8:00-5:00 Babies seen by [facility] providers Does NOT accept Medicaid [facility] [place] Cameron, MD; MacDonald, MD; Michaels, PA; Nayak, MD [address] [place] [address], [place],  [address] [phone] Mon-Fri 8:00-5:00 After hours clinic ([address] [place],  [address]) [phone] Mon-Fri 5:00-8:00, Sat 12:00-6:00, Sun 10:00-4:00 Babies seen by [facility] providers Accepting Medicaid [facility] [place] [address], [place], [place]  [address] [phone]   Fax - [phone]  [place] ([address]) [facility] [place] [facility] Andy, MD [address], [place], [place] [address] [phone] Mon-Fri 8:00-5:00 Babies seen by [facility] providers Does NOT accept Medicaid [facility] [place] ([facility] [place]) Eksir, MD [address], [place], [place] [address] [phone] Mon-Thur 8:00-7:00, Fri 8:00-5:00, Sat 8:00-12:00 Babies seen by providers at [facility] Accepting Medicaid - but does not have vaccinations in office (must be received elsewhere) Limited availability, please call early in hospitalization  [place] ([address]) [facility]  Charlene Flemming, MD [address], [place] [address] [phone]  Fax [phone]  [facility]  Kimberly Newton, MD, Annamarie Streilein, PA, Carla Hampton, PA [address] [place], [place]  [address] [phone] [facility]  [address], [place], [place] [address] [phone] [address], [place], [place] [address] [phone] (West Office)  [facility] [address], [place], [place] [address] [phone] [facility] [address] [place], [place], [place] [address] [phone] [facility] [address], [place], [place] [address] [phone] [facility] [address], [place], [place] [address] [phone] [facility] [address], [place], [place] [address] [phone] [facility] [address], [place], [place] [address] [phone] [facility]  [address], [place], [place] [address] [phone] Dr[facility] [address] [place], [place], [place] [address] [phone] [facility] [address], [place], [place] [address] [phone] [facility] [address], , [place] [address] [phone]  

## 2021-05-26 LAB — HIV ANTIBODY (ROUTINE TESTING W REFLEX): HIV Screen 4th Generation wRfx: NONREACTIVE

## 2021-05-26 LAB — CBC
Hematocrit: 33.8 % — ABNORMAL LOW (ref 34.0–46.6)
Hemoglobin: 11.3 g/dL (ref 11.1–15.9)
MCH: 30.1 pg (ref 26.6–33.0)
MCHC: 33.4 g/dL (ref 31.5–35.7)
MCV: 90 fL (ref 79–97)
Platelets: 236 10*3/uL (ref 150–450)
RBC: 3.76 x10E6/uL — ABNORMAL LOW (ref 3.77–5.28)
RDW: 12.9 % (ref 11.7–15.4)
WBC: 7.2 10*3/uL (ref 3.4–10.8)

## 2021-05-26 LAB — GLUCOSE TOLERANCE, 2 HOURS W/ 1HR
Glucose, 1 hour: 85 mg/dL (ref 70–179)
Glucose, 2 hour: 72 mg/dL (ref 70–152)
Glucose, Fasting: 81 mg/dL (ref 70–91)

## 2021-05-26 LAB — RPR: RPR Ser Ql: NONREACTIVE

## 2021-06-07 NOTE — Progress Notes (Signed)
? ?  PRENATAL VISIT NOTE ? ?Subjective:  ?Kathryn Benjamin is a 26 y.o. [phone] at [redacted]w[redacted]d being seen today for ongoing prenatal care.  She is currently monitored for the following issues for this low-risk pregnancy and has Supervision of low-risk pregnancy, first trimester and History of migraine headaches on their problem list. ? ?Patient reports no complaints.  Contractions: Not present. Vag. Bleeding: None.  Movement: Present. Denies leaking of fluid.  ? ?The following portions of the patient's history were reviewed and updated as appropriate: allergies, current medications, past family history, past medical history, past social history, past surgical history and problem list.  ? ?Objective:  ? ?Vitals:  ? 06/09/21 1130  ?BP: 111/62  ?Pulse: 94  ?Weight: 135 lb 1.6 oz (61.3 kg)  ? ? ?Fetal Status: Fetal Heart Rate (bpm): 140 Fundal Height: 30 cm Movement: Present    ? ?General:  Alert, oriented and cooperative. Patient is in no acute distress.  ?Skin: Skin is warm and dry. No rash noted.   ?Cardiovascular: Normal heart rate noted  ?Respiratory: Normal respiratory effort, no problems with respiration noted  ?Abdomen: Soft, gravid, appropriate for gestational age.  Pain/Pressure: Present     ?Pelvic: Cervical exam deferred        ?Extremities: Normal range of motion.  Edema: Trace  ?Mental Status: Normal mood and affect. Normal behavior. Normal judgment and thought content.  ? ?Assessment and Plan:  ?Pregnancy: [id number] at [redacted]w[redacted]d ?1. Supervision of low-risk pregnancy, first trimester ?- FH and BP normal ?- Continue routine prenatal care ? ?Preterm labor symptoms and general obstetric precautions including but not limited to vaginal bleeding, contractions, leaking of fluid and fetal movement were reviewed in detail with the patient. ?Please refer to After Visit Summary for other counseling recommendations.  ? ?Return in about 2 weeks (around 06/23/2021) for OB VISIT, MD or APP. ? ?No future  appointments. ? ? ?Milas Hock, MD ?

## 2021-06-09 ENCOUNTER — Encounter: Payer: Self-pay | Admitting: Obstetrics and Gynecology

## 2021-06-09 ENCOUNTER — Ambulatory Visit (INDEPENDENT_AMBULATORY_CARE_PROVIDER_SITE_OTHER): Payer: Medicaid Other | Admitting: Obstetrics and Gynecology

## 2021-06-09 VITALS — BP 111/62 | HR 94 | Wt 135.1 lb

## 2021-06-09 DIAGNOSIS — Z3491 Encounter for supervision of normal pregnancy, unspecified, first trimester: Secondary | ICD-10-CM

## 2021-06-09 NOTE — Progress Notes (Signed)
Patient declined food market today. ? ?Nolberto Hanlon, RN ?

## 2021-06-25 ENCOUNTER — Other Ambulatory Visit: Payer: Self-pay

## 2021-06-25 ENCOUNTER — Ambulatory Visit (INDEPENDENT_AMBULATORY_CARE_PROVIDER_SITE_OTHER): Payer: Medicaid Other | Admitting: Certified Nurse Midwife

## 2021-06-25 VITALS — BP 112/59 | HR 90 | Wt 136.6 lb

## 2021-06-25 DIAGNOSIS — Z3493 Encounter for supervision of normal pregnancy, unspecified, third trimester: Secondary | ICD-10-CM

## 2021-06-25 DIAGNOSIS — Z3A32 32 weeks gestation of pregnancy: Secondary | ICD-10-CM

## 2021-06-25 DIAGNOSIS — M25551 Pain in right hip: Secondary | ICD-10-CM

## 2021-06-25 MED ORDER — MAGNESIUM OXIDE -MG SUPPLEMENT 200 MG PO TABS: 400.0000 mg | ORAL_TABLET | Freq: Every day | ORAL | 3 refills | Status: DC

## 2021-06-25 MED ORDER — PRENATAL PLUS 27-1 MG PO TABS: 1.0000 | ORAL_TABLET | Freq: Every day | ORAL | 11 refills | Status: DC

## 2021-06-25 NOTE — Progress Notes (Signed)
Pt states has been having a lot of pain in right hip, sometime feels likes she can't walk and it feels that her hip will lock up. ?

## 2021-06-25 NOTE — Progress Notes (Signed)
? ?  PRENATAL VISIT NOTE ? ?Subjective:  ?Kathryn Benjamin is a 26 y.o. [phone] at [redacted]w[redacted]d being seen today for ongoing prenatal care.  She is currently monitored for the following issues for this low-risk pregnancy and has Supervision of low-risk pregnancy, first trimester and History of migraine headaches on their problem list. ? ?Patient reports  some pain in her right hip with difficulty standing up from the toilet, getting out of bed and walking around at times .  Contractions: Not present. Vag. Bleeding: None.  Movement: Present. Denies leaking of fluid.  ? ?The following portions of the patient's history were reviewed and updated as appropriate: allergies, current medications, past family history, past medical history, past social history, past surgical history and problem list.  ? ?Objective:  ? ?Vitals:  ? 06/25/21 1046  ?BP: (!) 112/59  ?Pulse: 90  ?Weight: 136 lb 9.6 oz (62 kg)  ? ?Fetal Status: Fetal Heart Rate (bpm): 144 Fundal Height: 32 cm Movement: Present  Presentation: Vertex ? ?General:  Alert, oriented and cooperative. Patient is in no acute distress.  ?Skin: Skin is warm and dry. No rash noted.   ?Cardiovascular: Normal heart rate noted  ?Respiratory: Normal respiratory effort, no problems with respiration noted  ?Abdomen: Soft, gravid, appropriate for gestational age.  Pain/Pressure: Present     ?Pelvic: Cervical exam deferred        ?Extremities: Normal range of motion.  Edema: None  ?Mental Status: Normal mood and affect. Normal behavior. Normal judgment and thought content.  ? ?Assessment and Plan:  ?Pregnancy: [id number] at [redacted]w[redacted]d ?1. Supervision of low-risk pregnancy, third trimester ?- Doing well overall, feeling plenty of fetal movement ?- prenatal vitamin w/FE, FA (PRENATAL 1 + 1) 27-1 MG TABS tablet; Take 1 tablet by mouth daily at 12 noon.  Dispense: 30 tablet; Refill: 11 ? ?2. [redacted] weeks gestation of pregnancy ?- Routine OB care ? ?3. Right hip pain ?- Baby heavily on her right side -  demonstrated round ligament and uterine massage to roll baby into the correct position, also instructed on daily stretching to prevent pain as the pregnancy progresses ?- Ambulatory referral to Physical Therapy ?- Magnesium Oxide (MAG-OXIDE) 200 MG TABS; Take 2 tablets (400 mg total) by mouth at bedtime. If that amount causes loose stools in the am, switch to 200mg  daily at bedtime.  Dispense: 60 tablet; Refill: 3 ? ?Preterm labor symptoms and general obstetric precautions including but not limited to vaginal bleeding, contractions, leaking of fluid and fetal movement were reviewed in detail with the patient. ?Please refer to After Visit Summary for other counseling recommendations.  ? ?Return in about 2 weeks (around 07/09/2021) for IN-PERSON, LOB/GBS. ? ?Future Appointments  ?Date Time Provider Hope  ?07/14/2021 11:15 AM Clarnce Flock, MD [facility]  ?07/21/2021  8:55 AM Anyanwu, Sallyanne Havers, MD [facility]  ?07/28/2021 11:15 AM Caren Macadam, MD [facility]  ? ? ?Gabriel Carina, CNM ? ?

## 2021-07-05 ENCOUNTER — Encounter: Payer: Self-pay | Admitting: Obstetrics and Gynecology

## 2021-07-14 ENCOUNTER — Ambulatory Visit (INDEPENDENT_AMBULATORY_CARE_PROVIDER_SITE_OTHER): Payer: Medicaid Other | Admitting: Family Medicine

## 2021-07-14 ENCOUNTER — Encounter: Payer: Self-pay | Admitting: Family Medicine

## 2021-07-14 VITALS — BP 110/71 | HR 80 | Wt 138.0 lb

## 2021-07-14 DIAGNOSIS — Z0289 Encounter for other administrative examinations: Secondary | ICD-10-CM

## 2021-07-14 DIAGNOSIS — Z3491 Encounter for supervision of normal pregnancy, unspecified, first trimester: Secondary | ICD-10-CM

## 2021-07-14 NOTE — Patient Instructions (Signed)
Eleccin del mtodo anticonceptivo Contraception Choices La anticoncepcin, o los mtodos anticonceptivos, hace referencia a los mtodos o dispositivos que evitan el embarazo. Mtodos hormonales Implante anticonceptivo Un implante anticonceptivo consiste en un tubo delgado de plstico que contiene una hormona que evita el embarazo. Es diferente de un dispositivo intrauterino (DIU). Un mdico lo inserta en la parte superior del brazo. Los implantes pueden ser eficaces durante un mximo de 3 aos. Inyecciones de progestina sola Las inyecciones de progestina sola contienen progestina, una forma sinttica de la hormona progesterona. Un mdico las administra cada 3 meses. Pldoras anticonceptivas Las pldoras anticonceptivas son pastillas que contienen hormonas que evitan el embarazo. Deben tomarse una vez al da, preferentemente a la misma hora cada da. Se necesita una receta para utilizar este mtodo anticonceptivo. Parche anticonceptivo El parche anticonceptivo contiene hormonas que evitan el embarazo. Se coloca en la piel, debe cambiarse una vez a la semana durante tres semanas y debe retirarse en la cuarta semana. Se necesita una receta para utilizar este mtodo anticonceptivo. Anillo vaginal Un anillo vaginal contiene hormonas que evitan el embarazo. Se coloca en la vagina durante tres semanas y se retira en la cuarta semana. Luego se repite el proceso con un anillo nuevo. Se necesita una receta para utilizar este mtodo anticonceptivo. Anticonceptivo de emergencia Los anticonceptivos de emergencia son mtodos para evitar un embarazo despus de tener sexo sin proteccin. Vienen en forma de pldora y pueden tomarse hasta 5 das despus de tener sexo. Funcionan mejor cuando se toman lo ms pronto posible luego de tener sexo. La mayora de los anticonceptivos de emergencia estn disponibles sin receta mdica. Este mtodo no debe utilizarse como el nico mtodo anticonceptivo. Mtodos de  barrera Condn masculino Un condn masculino es una vaina delgada que se coloca sobre el pene durante el sexo. Los condones evitan que el esperma ingrese en el cuerpo de la mujer. Pueden utilizarse con un una sustancia que mata a los espermatozoides (espermicida) para aumentar la efectividad. Deben desecharse despus de un uso. Condn femenino Un condn femenino es una vaina blanda y holgada que se coloca en la vagina antes de tener sexo. El condn evita que el esperma ingrese en el cuerpo de la mujer. Deben desecharse despus de un uso. Diafragma Un diafragma es una barrera blanda con forma de cpula. Se inserta en la vagina antes del sexo, junto con un espermicida. El diafragma bloquea el ingreso de esperma en el tero, y el espermicida mata a los espermatozoides. El diafragma debe permanecer en la vagina durante 6 a 8 horas despus de tener sexo y debe retirarse en el plazo de las 24 horas. Un diafragma es recetado y colocado por un mdico. Debe reemplazarse cada 1 a 2 aos, despus de dar a luz, de aumentar ms de 15lb (6.8kg) y de una ciruga plvica. Capuchn cervical Un capuchn cervical es una copa redonda y blanda de ltex o plstico que se coloca en el cuello uterino. Se inserta en la vagina antes del sexo, junto con un espermicida. Bloquea el ingreso del esperma en el tero. El capuchn debe permanecer en el lugar durante 6 a 8 horas despus de tener sexo y debe retirarse en el plazo de las 48 horas. Un capuchn cervical debe ser recetado y colocado por un mdico. Debe reemplazarse cada 2aos. Esponja Una esponja es una pieza blanda y circular de espuma de poliuretano que contiene espermicida. La esponja ayuda a bloquear el ingreso de esperma en el tero, y el espermicida mata a   los espermatozoides. Para utilizarla, debe humedecerla e insertarla en la vagina. Debe insertarse antes de tener sexo, debe permanecer dentro al menos durante 6 horas despus de tener sexo y debe retirarse y  desecharse en el plazo de las 30 horas. Espermicidas Los espermicidas son sustancias qumicas que matan o bloquean al esperma y no lo dejan ingresar al cuello uterino y al tero. Vienen en forma de crema, gel, supositorio, espuma o comprimido. Un espermicida debe insertarse en la vagina con un aplicador al menos 10 o 15 minutos antes de tener sexo para dar tiempo a que surta efecto. El proceso debe repetirse cada vez que tenga sexo. Los espermicidas no requieren receta mdica. Anticonceptivos intrauterinos Dispositivo intrauterino (DIU) Un DIU es un dispositivo en forma de T que se coloca en el tero. Existen dos tipos: DIU hormonal.Este tipo contiene progestina, una forma sinttica de la hormona progesterona. Este tipo puede permanecer colocado durante 3 a 5 aos. DIU de cobre.Este tipo est recubierto con un alambre de cobre. Puede permanecer colocado durante 10 aos. Mtodos anticonceptivos permanentes Ligadura de trompas en la mujer En este mtodo, se sellan, atan u obstruyen las trompas de Falopio durante una ciruga para evitar que el vulo descienda hacia el tero. Esterilizacin histeroscpica En este mtodo, se coloca un implante pequeo y flexible dentro de cada trompa de Falopio. Los implantes hacen que se forme un tejido cicatricial en las trompas de Falopio y que las obstruya para que el espermatozoide no pueda llegar al vulo. El procedimiento demora alrededor de 3 meses para que sea efectivo. Debe utilizarse otro mtodo anticonceptivo durante esos 3 meses. Esterilizacin masculina Este es un procedimiento que consiste en atar los conductos que transportan el esperma (vasectoma). Luego del procedimiento, el hombre puede eyacular lquido (semen). Debe utilizarse otro mtodo anticonceptivo durante 3 meses despus del procedimiento. Mtodos de planificacin natural Planificacin familiar natural En este mtodo, la pareja no tiene sexo durante los das en que la mujer podra quedar  embarazada. Mtodo calendario En este mtodo, la mujer realiza un seguimiento de la duracin de cada ciclo menstrual, identifica los das en los que se puede producir un embarazo y no tiene sexo durante esos das. Mtodo de la ovulacin En este mtodo, la pareja evita tener sexo durante la ovulacin. Mtodo sintotrmico Este mtodo implica no tener sexo durante la ovulacin. Normalmente, la mujer comprueba la ovulacin al observar cambios en su temperatura y en la consistencia del moco cervical. Mtodo posovulacin En este mtodo, la pareja espera a que finalice la ovulacin para tener sexo. Dnde buscar ms informacin Centers for Disease Control and Prevention (Centros para el Control y la Prevencin de Enfermedades): www.cdc.gov Resumen La anticoncepcin, o los mtodos anticonceptivos, hace referencia a los mtodos o dispositivos que evitan el embarazo. Los mtodos anticonceptivos hormonales incluyen implantes, inyecciones, pastillas, parches, anillos vaginales y anticonceptivos de emergencia. Los mtodos anticonceptivos de barrera pueden incluir condones masculinos, condones femeninos, diafragmas, capuchones cervicales, esponjas y espermicidas. Existen dos tipos de DIU (dispositivo intrauterino). Un DIU puede colocarse en el tero de una mujer para evitar el embarazo durante 3 a 5 aos. La esterilizacin permanente puede realizarse mediante un procedimiento tanto en los hombres como en las mujeres. Los mtodos de planificacin familiar natural implican no tener sexo durante los das en que la mujer podra quedar embarazada. Esta informacin no tiene como fin reemplazar el consejo del mdico. Asegrese de hacerle al mdico cualquier pregunta que tenga. Document Revised: 10/22/2019 Document Reviewed: 10/22/2019 Elsevier Patient Education  2022 Elsevier   Inc.  

## 2021-07-14 NOTE — Progress Notes (Signed)
? ?  Subjective:  ?Kathryn Benjamin is a 26 y.o. [phone] at [redacted]w[redacted]d being seen today for ongoing prenatal care.  She is currently monitored for the following issues for this low-risk pregnancy and has Supervision of low-risk pregnancy, first trimester and History of migraine headaches on their problem list. ? ?Patient reports  pelvic pain .  Contractions: Not present. Vag. Bleeding: None.  Movement: Present. Denies leaking of fluid.  ? ?The following portions of the patient's history were reviewed and updated as appropriate: allergies, current medications, past family history, past medical history, past social history, past surgical history and problem list. Problem list updated. ? ?Objective:  ? ?Vitals:  ? 07/14/21 1144  ?BP: 110/71  ?Pulse: 80  ?Weight: 138 lb (62.6 kg)  ? ? ?Fetal Status: Fetal Heart Rate (bpm): 137   Movement: Present    ? ?General:  Alert, oriented and cooperative. Patient is in no acute distress.  ?Skin: Skin is warm and dry. No rash noted.   ?Cardiovascular: Normal heart rate noted  ?Respiratory: Normal respiratory effort, no problems with respiration noted  ?Abdomen: Soft, gravid, appropriate for gestational age. Pain/Pressure: Present     ?Pelvic: Vag. Bleeding: None     ?Cervical exam deferred        ?Extremities: Normal range of motion.  Edema: None  ?Mental Status: Normal mood and affect. Normal behavior. Normal judgment and thought content.  ? ?Urinalysis:     ? ?Assessment and Plan:  ?Pregnancy: [id number] at 103w2d ? ?1. Supervision of low-risk pregnancy, first trimester ?BP and FHR normal ?Discussed maternity belt, sleeping positions for pelvic pain ?Swabs next visit ? ?Preterm labor symptoms and general obstetric precautions including but not limited to vaginal bleeding, contractions, leaking of fluid and fetal movement were reviewed in detail with the patient. ?Please refer to After Visit Summary for other counseling recommendations.  ?Return in 1 week (on 07/21/2021) for [facility], ob  visit. ? ? ?Venora Maples, MD ? ?

## 2021-07-17 ENCOUNTER — Encounter: Payer: Self-pay | Admitting: Radiology

## 2021-07-21 ENCOUNTER — Ambulatory Visit (INDEPENDENT_AMBULATORY_CARE_PROVIDER_SITE_OTHER): Payer: Medicaid Other | Admitting: Obstetrics & Gynecology

## 2021-07-21 ENCOUNTER — Other Ambulatory Visit (HOSPITAL_COMMUNITY)
Admission: RE | Admit: 2021-07-21 | Discharge: 2021-07-21 | Disposition: A | Discharge status: Home / Self Care | Payer: Medicaid Other | Source: Ambulatory Visit | Attending: Obstetrics & Gynecology | Admitting: Obstetrics & Gynecology

## 2021-07-21 VITALS — BP 109/64 | HR 80 | Wt 139.0 lb

## 2021-07-21 DIAGNOSIS — Z3A36 36 weeks gestation of pregnancy: Secondary | ICD-10-CM | POA: Diagnosis not present

## 2021-07-21 DIAGNOSIS — Z3493 Encounter for supervision of normal pregnancy, unspecified, third trimester: Secondary | ICD-10-CM

## 2021-07-21 LAB — GLUCOSE, CAPILLARY: Glucose-Capillary: 82 mg/dL (ref 70–99)

## 2021-07-21 NOTE — Progress Notes (Signed)
? ?  PRENATAL VISIT NOTE ? ?Subjective:  ?Kathryn Benjamin is a 26 y.o. [phone] at [redacted]w[redacted]d being seen today for ongoing prenatal care. Patient is Spanish-speaking only, interpreter present for this encounter. Patient is Spanish-speaking only, interpreter present for this encounter. She is currently monitored for the following issues for this low-risk pregnancy and has Supervision of low-risk pregnancy, third trimester and History of migraine headaches on their problem list. ? ?Patient reports  episode of feeling light-headed today while waiting for evaluation today, vitals  and blood sugar were okay here, feeling better. .  Contractions: Not present.  .  Movement: Present. Denies leaking of fluid.  ? ?The following portions of the patient's history were reviewed and updated as appropriate: allergies, current medications, past family history, past medical history, past social history, past surgical history and problem list.  ? ?Objective:  ? ?Vitals:  ? 07/21/21 0906  ?BP: 109/64  ?Pulse: 80  ?Weight: 139 lb (63 kg)  ? ? ?Fetal Status: Fetal Heart Rate (bpm): 123 Fundal Height: 34 cm Movement: Present  Presentation: Vertex ? ?General:  Alert, oriented and cooperative. Patient is in no acute distress.  ?Skin: Skin is warm and dry. No rash noted.   ?Cardiovascular: Normal heart rate noted  ?Respiratory: Normal respiratory effort, no problems with respiration noted  ?Abdomen: Soft, gravid, appropriate for gestational age.  Pain/Pressure: Present     ?Pelvic: Cervical exam performed in the presence of a chaperone Dilation: Fingertip Effacement (%): Thick Station: Ballotable  ?Extremities: Normal range of motion.  Edema: None  ?Mental Status: Normal mood and affect. Normal behavior. Normal judgment and thought content.  ? ?Assessment and Plan:  ?Pregnancy: [id number] at [redacted]w[redacted]d ?1. [redacted] weeks gestation of pregnancy ?2. Supervision of low-risk pregnancy, third trimester ?Pelvic cultures done today, will follow up results and  manage accordingly. ?- Culture, beta strep (group b only) ?- Cervicovaginal ancillary only( [place]) ?Preterm labor symptoms and general obstetric precautions including but not limited to vaginal bleeding, contractions, leaking of fluid and fetal movement were reviewed in detail with the patient. ?Please refer to After Visit Summary for other counseling recommendations.  ? ?Return in about 1 week (around 07/28/2021) for OFFICE OB VISIT (MD or APP). ? ?Future Appointments  ?Date Time Provider Department Center  ?07/28/2021 11:15 AM Federico Flake, MD [facility]  ? ? ?Jaynie Collins, MD ? ?

## 2021-07-22 LAB — CERVICOVAGINAL ANCILLARY ONLY
Chlamydia: NEGATIVE
Comment: NEGATIVE
Comment: NORMAL
Neisseria Gonorrhea: NEGATIVE

## 2021-07-25 LAB — CULTURE, BETA STREP (GROUP B ONLY): Strep Gp B Culture: NEGATIVE

## 2021-07-28 ENCOUNTER — Ambulatory Visit (INDEPENDENT_AMBULATORY_CARE_PROVIDER_SITE_OTHER): Payer: Medicaid Other | Admitting: Family Medicine

## 2021-07-28 VITALS — BP 112/69 | HR 90 | Wt 140.2 lb

## 2021-07-28 DIAGNOSIS — Z3493 Encounter for supervision of normal pregnancy, unspecified, third trimester: Secondary | ICD-10-CM

## 2021-07-28 DIAGNOSIS — Z3A37 37 weeks gestation of pregnancy: Secondary | ICD-10-CM

## 2021-07-28 NOTE — Progress Notes (Signed)
? ?  PRENATAL VISIT NOTE ? ?Subjective:  ?Kathryn Benjamin is a 26 y.o. [phone] at [redacted]w[redacted]d being seen today for ongoing prenatal care.  She is currently monitored for the following issues for this low-risk pregnancy and has Supervision of low-risk pregnancy, third trimester and History of migraine headaches on their problem list. ? ?Patient reports no complaints.  Contractions: Irritability. Vag. Bleeding: None.  Movement: Present. Denies leaking of fluid.  ? ?The following portions of the patient's history were reviewed and updated as appropriate: allergies, current medications, past family history, past medical history, past social history, past surgical history and problem list.  ? ?Objective:  ? ?Vitals:  ? 07/28/21 1120  ?BP: 112/69  ?Pulse: 90  ?Weight: 140 lb 3.2 oz (63.6 kg)  ? ? ?Fetal Status: Fetal Heart Rate (bpm): 138 Fundal Height: 37 cm Movement: Present    ? ?General:  Alert, oriented and cooperative. Patient is in no acute distress.  ?Skin: Skin is warm and dry. No rash noted.   ?Cardiovascular: Normal heart rate noted  ?Respiratory: Normal respiratory effort, no problems with respiration noted  ?Abdomen: Soft, gravid, appropriate for gestational age.  Pain/Pressure: Present     ?Pelvic: Cervical exam deferred        ?Extremities: Normal range of motion.  Edema: None  ?Mental Status: Normal mood and affect. Normal behavior. Normal judgment and thought content.  ? ?Assessment and Plan:  ?Pregnancy: [id number] at [redacted]w[redacted]d ? ?1. Supervision of low-risk pregnancy, third trimester ?Given peds list today ?Having mild cramping in lower abdomen- no regular strong ctx ?Reviewed when to go to the hospital ?Declines cervical check today ? ?Term labor symptoms and general obstetric precautions including but not limited to vaginal bleeding, contractions, leaking of fluid and fetal movement were reviewed in detail with the patient. ?Please refer to After Visit Summary for other counseling recommendations.  ? ?Return  in about 1 week (around 08/04/2021) for Routine prenatal care. ? ?Future Appointments  ?Date Time Provider Department Center  ?08/06/2021  8:15 AM Adam Phenix, MD [facility]  ?08/13/2021 10:35 AM Warden Fillers, MD [facility]  ? ? ?Federico Flake, MD ?

## 2021-07-28 NOTE — Patient Instructions (Signed)
AREA PEDIATRIC/FAMILY PRACTICE PHYSICIANS ? ?[place] ([address]) ?[facility] ?Chambliss, MD; Eniola, MD; Hale, MD; Hensel, MD; McDiarmid, MD; McIntyer, MD; Neal, MD; Walden, MD ?[address] [place], [place] [address] ?[phone] ?Mon-Fri 8:30-12:30, 1:30-5:00 ?Providers come to see babies at [facility] ?Accepting Medicaid ?[facility] ?Limited providers who accept newborns: Koirala, MD; Morrow, MD; Wolters, MD ?[address], [place], [place] [address] ?[phone] ?Mon-Fri 8:00-5:30 ?Babies seen by providers at [facility] ?Does NOT accept Medicaid ?Please call early in hospitalization for appointment (limited availability)  ?[facility] ?Mulberry, MD ?[address] [place], [place] [address] ?[phone] ?Mon, Tue, Thur, Fri 8:30-5:00, Wed 10:00-7:00 (closed 1-2pm) ?Babies seen by [facility] providers ?Accepting Medicaid ?[facility] ?Rubin, MD ?[address], [place], [place] [address] ?[phone] ?Mon-Fri 8:30-5:00, Sat 8:30-12:00 ?Provider comes to see babies at [facility] ?Accepting Medicaid ?Must have been referred from current patients or contacted office prior to delivery ?[facility] ([facility]) ?Brown, MD; Chandler, MD; Ettefagh, MD; Grant, MD; Lester, MD; McCormick, MD; McQueen, MD; Prose, MD; Simha, MD; Stanley, MD; Stryffeler, NP; Tebben, NP ?[address], [place], [place] [address] ?[phone] ?Mon, Tue, Thur, Fri 8:30-5:30, Wed 9:30-5:30, Sat 8:30-12:30 ?Babies seen by [facility] providers ?Accepting Medicaid ?Only accepting infants of first-time parents or siblings of current patients ?Hospital discharge coordinator will make follow-up appointment ?Jack Amos ?[address], [place], [place]  [address] ?[phone]   Fax - [phone] ?[facility] ?[address], [place], [place]  [address] ?Phone - [phone]   Fax - [phone] ?Shilpa Gosrani ?[address], [place], [place]  [address] ?[phone] ? ?[place] ([address]) ?[facility] ?Bates, MD; [place], MD; Cooper, Cox, MD; MD; Davis, MD; Dovico, MD; Ettefaugh, MD; Little, MD; Lowe, MD; Keiffer, MD; Melvin, MD; Sumner, MD; Williams, MD ?[address], [place], [place] [address] ?[phone] ?Mon-Fri 8:30-5:00 (extended evenings Mon-Thur as needed), Sat-Sun 10:00-1:00 ?Providers come to see babies at [facility] ?Accepting Medicaid for families of first-time babies and families with all children in the household age 3 and under. Must register with office prior to making appointment (M-F only). ?[facility] ?Henson, NP; Knapp, MD; Lalonde, MD; Tysinger, PA ?[address] [place], [place] [address] ?[phone] ?Mon-Fri 8:00-5:00 ?Babies seen by providers at [facility] ?Does NOT accept Medicaid/Commercial Insurance Only ?[facility] ([facility])  ?Artis, MD; Barnes, MD; Bratton, MD; Coccaro, MD; Lockett Gardner, MD; Kramer, MD; Marshall, MD; Netherton, MD; Poleto, MD; Skinner, MD ?[address] [place], [place] [address] ?[phone] ?Mon-Fri 8:30-5:30, Sat (Oct.-Mar.) 9:00-1:00 ?Babies seen by providers at [facility] ?Accepting Medicaid ? ?[place] ([address]) ?[facility] ?Reid, MD; Warner, MD ?[address], [place], [place] [address] ?[phone] ?Mon-Fri 8:30-5:00, Sat 8:30-12:00 ?Providers come to see babies at [facility] ?Does NOT accept Medicaid ?[facility] ?Becker, PA; Hagler, MD; Scifres, PA; Sun, MD; Swayne, MD ?[address], [place], [place] [address] ?[phone] ?Mon-Fri 8:00-5:00 ?Babies seen by providers at [facility] ?Does NOT accept Medicaid ?Only accepting babies of parents who  are patients ?Please call early in hospitalization for appointment (limited availability) ? [facility] ?Clark, MD; Frye, MD; Kelleher, MD; Mack, NP; Miller, MD; O'Keller, MD; Patterson, NP; Pudlo, MD; Puzio, MD; Thomas, MD; Tucker, MD; Twiselton, MD ?[address], [place], [place] [address] ?[phone] ?Mon-Fri 8:00-5:00, Sat 9:00-12:00 ?Providers come to see babies at [facility] ?Does NOT accept Medicaid ? ?[place] ([address]) ?[facility] ?Limited providers accepting new patients: Brake, NP; Wharton, PA ?[address], [place], [place] [address] ?[phone] ?Mon-Fri 8:00-5:00 ?Babies seen by providers at [facility] ?Does NOT accept Medicaid ?Only accepting babies of parents who are patients ?Please call early in hospitalization for appointment (limited availability) ?[facility] ?Gay, MD; Quinlan, MD ?[address] [place], [place] [address] ?[phone] (press [phone] to schedule appointment) ?Mon-Fri 8:00-5:00 ?Providers come to see babies at [facility] ?Does NOT accept Medicaid ?[facility] ?Mazer, MD ?[address] [place], [place] [address] ?[phone] ?Mon-Fri 8:30-5:00 (lunch 12:30-1:00), extended hours by appointment only Wed 5:00-6:30 ?Babies seen by [facility] providers ?Accepting Medicaid ?[facility] ?Banks, MD; Jordan, MD; Koberlein, MD ?[address], [place], [place] [address] ?[phone] ?Mon-Fri 8:00-5:00 ?Babies seen by [facility] providers ?Does NOT accept Medicaid ?[facility] ?Parker, MD; Hunter, MD; Wallace, DO ?[address] [place], [place] [address] ?[phone] ?Mon-Fri 8:00-5:00 ?Babies seen by [facility] providers ?Does NOT accept Medicaid ?[facility] ?Brandon, PA; Brecken, PA; Christy, NP; Dees, MD; DeClaire, MD; DeWeese, MD; Hansen, NP; Mills, NP; Parrish, NP; Smoot, NP; Summer, MD; Vapne,  MD ?[address] [place], [place] [address] ?[phone] ?Mon-Fri 8:30-5:00, Sat 10:00-1:00 ?Providers come to see babies at [facility] ?Does NOT accept Medicaid ?Free prenatal information session Tuesdays at 4:45pm ?[facility] ?Bouska, MD; Gordon, PA; Jeffery, PA; Weber, PA ?[address] [place] [address] ?[phone] ?Mon-Fri 7:30-5:30 ?Babies seen by [facility] providers ?[facility] ?[address], [place], [place]  [address] ?[phone]   Fax - [phone] ? ?[place] ([address]) ?[facility] ?Reese, MD ?[address] [place], [place] [address] ?[phone] ?Mon-Thur 8:00-6:00 ?Providers come to see babies at [facility] ?Accepting Medicaid ?[facility] ?Anderson, NP; Badger, MD; Beal, PA; Spencer, PA ?[address] [place], [place] [address] ?[phone] ?Mon-Thur 7:30-7:30, Fri 7:30-4:30 ?Babies seen by [facility] providers ?Accepting Medicaid ?[facility] ?Agbuya, MD; Klett, NP; Romgoolam, MD ?[address], [place], [place] [address] ?[phone] ?Mon-Fri 8:30-5:00, Sat 8:30-12:00 ?Providers come to see babies at [facility] ?Accepting Medicaid ?Must have ?Meet & Greet? appointment at office prior to delivery ?[facility] ([facility]) ?McCord, MD; Wallace, MD; Wood, MD ?[address], [place], [place] [address] ?[phone] ?Mon-Wed 8:00-6:00, Thur-Fri 8:00-5:00, Sat 9:00-12:00 ?Providers come to see babies at [facility] ?Does NOT accept Medicaid ?Only accepting siblings of current patients ?[facility]  ?[address], [place], [place]  [address] ?[phone]   Fax - [phone] ?[facility] ?[address], [place],   [address] ?[phone]   Fax -  [phone] ? ?[place] ([address]) ?[facility] ?Cirigliano, DO; Matthews, DO ?[address] ,  [address] ?[phone] ?Mon-Fri 7:00-5:00 ?Babies seen by [facility]

## 2021-08-06 ENCOUNTER — Ambulatory Visit (INDEPENDENT_AMBULATORY_CARE_PROVIDER_SITE_OTHER): Payer: Medicaid Other | Admitting: Obstetrics & Gynecology

## 2021-08-06 VITALS — BP 112/60 | HR 78 | Wt 143.0 lb

## 2021-08-06 DIAGNOSIS — Z3493 Encounter for supervision of normal pregnancy, unspecified, third trimester: Secondary | ICD-10-CM

## 2021-08-06 NOTE — Progress Notes (Signed)
? ?  PRENATAL VISIT NOTE ? ?Subjective:  ?Kathryn Benjamin is a 26 y.o. [phone] at [redacted]w[redacted]d being seen today for ongoing prenatal care.  She is currently monitored for the following issues for this low-risk pregnancy and has Supervision of low-risk pregnancy, third trimester and History of migraine headaches on their problem list. ? ?Patient reports occasional contractions.  Contractions: Irritability. Vag. Bleeding: None.  Movement: Present. Denies leaking of fluid.  ? ?The following portions of the patient's history were reviewed and updated as appropriate: allergies, current medications, past family history, past medical history, past social history, past surgical history and problem list.  ? ?Objective:  ? ?Vitals:  ? 08/06/21 0825  ?BP: 112/60  ?Pulse: 78  ?Weight: 143 lb (64.9 kg)  ? ? ?Fetal Status: Fetal Heart Rate (bpm): 131   Movement: Present  Presentation: Vertex ? ?General:  Alert, oriented and cooperative. Patient is in no acute distress.  ?Skin: Skin is warm and dry. No rash noted.   ?Cardiovascular: Normal heart rate noted  ?Respiratory: Normal respiratory effort, no problems with respiration noted  ?Abdomen: Soft, gravid, appropriate for gestational age.  Pain/Pressure: Present     ?Pelvic: Cervical exam performed in the presence of a chaperone Dilation: 1.5 Effacement (%): 30 Station: -3  ?Extremities: Normal range of motion.  Edema: None  ?Mental Status: Normal mood and affect. Normal behavior. Normal judgment and thought content.  ? ?Assessment and Plan:  ?Pregnancy: [id number] at [redacted]w[redacted]d ?1. Supervision of low-risk pregnancy, third trimester ?Labor precautions ? ?Term labor symptoms and general obstetric precautions including but not limited to vaginal bleeding, contractions, leaking of fluid and fetal movement were reviewed in detail with the patient. ?Please refer to After Visit Summary for other counseling recommendations.  ? ?Return in about 1 week (around 08/13/2021). ? ?Future Appointments   ?Date Time Provider Department Center  ?08/13/2021 10:35 AM Warden Fillers, MD [facility]  ? ? ?Scheryl Darter, MD ? ?

## 2021-08-09 ENCOUNTER — Inpatient Hospital Stay (HOSPITAL_BASED_OUTPATIENT_CLINIC_OR_DEPARTMENT_OTHER): Payer: Medicaid Other

## 2021-08-09 ENCOUNTER — Other Ambulatory Visit: Payer: Self-pay

## 2021-08-09 ENCOUNTER — Encounter (HOSPITAL_COMMUNITY): Payer: Self-pay | Admitting: Obstetrics and Gynecology

## 2021-08-09 ENCOUNTER — Inpatient Hospital Stay (EMERGENCY_DEPARTMENT_HOSPITAL)
Admission: AD | Admit: 2021-08-09 | Discharge: 2021-08-09 | Disposition: A | Discharge status: Home / Self Care | Payer: Medicaid Other | Source: Home / Self Care | Attending: Obstetrics and Gynecology | Admitting: Obstetrics and Gynecology

## 2021-08-09 ENCOUNTER — Encounter: Payer: Self-pay | Admitting: Obstetrics & Gynecology

## 2021-08-09 DIAGNOSIS — Z3A39 39 weeks gestation of pregnancy: Secondary | ICD-10-CM

## 2021-08-09 DIAGNOSIS — O368130 Decreased fetal movements, third trimester, not applicable or unspecified: Secondary | ICD-10-CM

## 2021-08-09 DIAGNOSIS — Z3689 Encounter for other specified antenatal screening: Secondary | ICD-10-CM

## 2021-08-09 HISTORY — DX: Anemia, unspecified: D64.9

## 2021-08-09 NOTE — MAU Provider Note (Signed)
?History  ?  ? ?[id number] ? ?Arrival date and time: 08/09/21 1515 ?  ? ?Chief Complaint  ?Patient presents with  ? Decreased Fetal Movement  ? ? ? ?HPI ?Kathryn Benjamin is a 26 y.o. at [redacted]w[redacted]d by 8 week ultrasound who presents for decreased fetal movement. ?Reports feeling no fetal movement since last night. Denies contractions, abdominal trauma, vaginal bleeding, or LOF. Denies complications with the pregnancy.   ? ?OB History   ? ? Gravida  ?4  ? Para  ?1  ? Term  ?1  ? Preterm  ?0  ? AB  ?2  ? Living  ?1  ?  ? ? SAB  ?2  ? IAB  ?0  ? Ectopic  ?0  ? Multiple  ?0  ? Live Births  ?1  ?   ?  ?  ? ? ?Past Medical History:  ?Diagnosis Date  ? Anemia   ? first preg  ? ? ?Past Surgical History:  ?Procedure Laterality Date  ? NO PAST SURGERIES    ? ? ?Family History  ?Problem Relation Age of Onset  ? Diabetes Mother   ? Healthy Father   ? Diabetes Maternal Uncle   ? ? ?No Known Allergies ? ?No current facility-administered medications on file prior to encounter.  ? ?Current Outpatient Medications on File Prior to Encounter  ?Medication Sig Dispense Refill  ? acetaminophen (TYLENOL) 325 MG tablet Take 650 mg by mouth every 6 (six) hours as needed.    ? Magnesium Oxide (MAG-OXIDE) 200 MG TABS Take 2 tablets (400 mg total) by mouth at bedtime. If that amount causes loose stools in the am, switch to 200mg  daily at bedtime. 60 tablet 3  ? prenatal vitamin w/FE, FA (PRENATAL 1 + 1) 27-1 MG TABS tablet Take 1 tablet by mouth daily at 12 noon. 30 tablet 11  ? ? ? ?ROS ?Pertinent positives and negative per HPI, all others reviewed and negative ? ?Physical Exam  ? ?BP 112/61 (BP Location: Right Arm)   Pulse 90   Temp 98.3 ?F (36.8 ?C) (Oral)   Resp 16   Ht 5\' 1"  (1.549 m)   Wt 65.3 kg   LMP 10/02/2020 (Approximate)   SpO2 99%   BMI 27.21 kg/m?  ? ?Patient Vitals for the past 24 hrs: ? BP Temp Temp src Pulse Resp SpO2 Height Weight  ?08/09/21 1535 112/61 98.3 ?F (36.8 ?C) Oral 90 16 99 % 5\' 1"  (1.549 m) 65.3 kg   ?08/09/21 1530 -- -- -- -- -- -- -- 65.3 kg  ? ? ?Physical Exam ?Vitals and nursing note reviewed.  ?Constitutional:   ?   General: She is not in acute distress. ?   Appearance: Normal appearance.  ?HENT:  ?   Head: Normocephalic and atraumatic.  ?Eyes:  ?   General: No scleral icterus. ?   Conjunctiva/sclera: Conjunctivae normal.  ?   Pupils: Pupils are equal, round, and reactive to light.  ?Pulmonary:  ?   Effort: Pulmonary effort is normal. No respiratory distress.  ?Neurological:  ?   Mental Status: She is alert.  ?Psychiatric:     ?   Mood and Affect: Mood normal.     ?   Behavior: Behavior normal.  ?  ? ? ?FHT ?Baseline 135, moderate variability, 15x15 accels, no decels ?Toco: irregular ?Cat: 1 ? ?Labs ?No results found for this or any previous visit (from the past 24 hour(s)). ? ?Imaging ?10/09/21 MFM FETAL BPP WO NON STRESS ? ?  Result Date: 08/09/2021 ?----------------------------------------------------------------------  OBSTETRICS REPORT                       (Signed Final 08/09/2021 05:43 pm) ---------------------------------------------------------------------- Patient Info  ID #:       [id number]                          D.O.B.:  08-21-1995 (26 yrs)  Name:       Kathryn Benjamin                Visit Date: 08/09/2021 04:53 pm              [facility] ---------------------------------------------------------------------- Performed By  Attending:        Noralee Spaceavi Shankar MD        Ref. Address:     [address]                                                             [address]                                                             [place]                                                             [id number]  Performed By:     Percell BostonHeather Waken          [facility]  Referred By:      Hermina StaggersMICHAEL L ERVIN                    MD ---------------------------------------------------------------------- Orders  #  Description                            Code        Ordered By  1  US MFM FETAL BPP WO NON               [id number]    Lilia Letterman     STRESS ----------------------------------------------------------------------  #  Order #                     Accession #                Episode #  1  [id number]                   [id number]  [id number] ---------------------------------------------------------------------- Indications  Decreased fetal movements, third trimester,    O36.8130  unspecified  [redacted] weeks gestation of pregnancy                Z3A.39  Encounter for antenatal screening,             Z36.9  unspecified ---------------------------------------------------------------------- Fetal Evaluation  Num Of Fetuses:         1  Fetal Heart Rate(bpm):  131  Cardiac Activity:       Observed  Presentation:           Cephalic  Placenta:               Anterior  Amniotic Fluid  AFI FV:      Within normal limits  AFI Sum(cm)     %Tile       Largest Pocket(cm)  13.1            52          6  RUQ(cm)       RLQ(cm)       LUQ(cm)        LLQ(cm)  6             1.6           5.5            0 ---------------------------------------------------------------------- Biophysical Evaluation  Amniotic F.V:   Within normal limits       F. Tone:        Observed  F. Movement:    Observed                   Score:          8/8  F. Breathing:   Observed ---------------------------------------------------------------------- OB History  Gravidity:    4         Term:   1        Prem:   0        SAB:   2 ---------------------------------------------------------------------- Gestational Age  Best:          39w 0d     Det. By:  Marcella Dubs         EDD:   08/16/21                                      (01/07/21) ---------------------------------------------------------------------- Anatomy  Thoracic:              Appears normal         Kidneys:                Appear normal  Stomach:               Appears normal, left   Bladder:                Appears normal                          sided ---------------------------------------------------------------------- Impression  Patient was evaluated for c/o decreased fetal movements .  Amniotic fluid is normal and good fetal activity is seen  .Antenatal testing is reassuring. BPP 8/8.  Cephalic  presentation.  Antenatal testing has limitations in detecting fetal  compromise. Induction of labor may be offered given that her  gestational age is 27 weeks. ----------------------------------------------------------------------  Noralee Space, MD Electronically Signed Final Report   08/09/2021 05:43 pm ----------------------------------------------------------------------  ? ?MAU Course  ?Procedures ?Lab Orders  ?No laboratory test(s) ordered today  ? ?No orders of the defined types were placed in this encounter. ? ?Imaging Orders    ?     [place] MFM FETAL BPP WO NON STRESS    ? ?MDM ?Patient presents with decreased movement - no fetal movement since last night. While in MAU she had a reactive fetal tracing but only noted 3 movements in 1+ hour of monitoring. Recommended IOL due to [facility] complaint at term. Patient declines. Informed patient that decreased movement could be an early sign of fetal hypoxemia. Discussed recommendation for BPP if her preference is to be discharged home - pt agreeable.  ? ?BPP 8/8 (10/10) with normal AFI. Reviewed results with patient. Patient is now agreeable to induction but states she needs to arrange childcare before she returns & plans on returning to the hospital sometime before tomorrow morning. As patient had reassuring testing, will discharge home with plan for patient to return soon for admission.  ? ?Dr. Adrian Blackwater consulted for management of this patient.  ? ?Assessment and Plan  ? ?1. Decreased fetal movements in third trimester, single or unspecified fetus   ?2. NST (non-stress test) reactive   ?3. [redacted] weeks gestation of pregnancy   ? ?-Pt to return for IOL once childcare secured  ?-Dr. Charlotta Newton notified of  plan ? ?Judeth Horn, NP ?08/09/21 ?5:53 PM ? ? ?

## 2021-08-09 NOTE — MAU Note (Addendum)
Kathryn Benjamin is a 26 y.o. at [redacted]w[redacted]d here in MAU reporting: no fetal movement today.  Was moving normal yesterday. No bleeding or leaking.  Having int lower pelvic pain/pressure- not a new problem. ? ?Onset of complaint: all day ?Pain 6 ?FHT:142 ?Lab orders placed from triage:  none ?

## 2021-08-10 ENCOUNTER — Other Ambulatory Visit: Payer: Self-pay

## 2021-08-10 ENCOUNTER — Encounter (HOSPITAL_COMMUNITY): Payer: Self-pay | Admitting: Obstetrics and Gynecology

## 2021-08-10 ENCOUNTER — Inpatient Hospital Stay (HOSPITAL_COMMUNITY)
Admission: AD | Admit: 2021-08-10 | Discharge: 2021-08-12 | DRG: 807 | Disposition: A | Discharge status: Home / Self Care | Payer: Medicaid Other | Attending: Obstetrics and Gynecology | Admitting: Obstetrics and Gynecology

## 2021-08-10 DIAGNOSIS — Z3493 Encounter for supervision of normal pregnancy, unspecified, third trimester: Principal | ICD-10-CM

## 2021-08-10 DIAGNOSIS — Z8669 Personal history of other diseases of the nervous system and sense organs: Secondary | ICD-10-CM

## 2021-08-10 DIAGNOSIS — O368130 Decreased fetal movements, third trimester, not applicable or unspecified: Principal | ICD-10-CM | POA: Diagnosis present

## 2021-08-10 DIAGNOSIS — Z3A39 39 weeks gestation of pregnancy: Secondary | ICD-10-CM | POA: Diagnosis not present

## 2021-08-10 DIAGNOSIS — Z3689 Encounter for other specified antenatal screening: Secondary | ICD-10-CM | POA: Diagnosis not present

## 2021-08-10 LAB — TYPE AND SCREEN
ABO/RH(D): O POS
Antibody Screen: NEGATIVE

## 2021-08-10 LAB — CBC
HCT: 34 % — ABNORMAL LOW (ref 36.0–46.0)
Hemoglobin: 11.7 g/dL — ABNORMAL LOW (ref 12.0–15.0)
MCH: 29.6 pg (ref 26.0–34.0)
MCHC: 34.4 g/dL (ref 30.0–36.0)
MCV: 86.1 fL (ref 80.0–100.0)
Platelets: 207 10*3/uL (ref 150–400)
RBC: 3.95 MIL/uL (ref 3.87–5.11)
RDW: 14 % (ref 11.5–15.5)
WBC: 8.5 10*3/uL (ref 4.0–10.5)
nRBC: 0 % (ref 0.0–0.2)

## 2021-08-10 MED ORDER — OXYTOCIN-SODIUM CHLORIDE 30-0.9 UT/500ML-% IV SOLN
1.0000 m[IU]/min | INTRAVENOUS | Status: DC
  Administered 2021-08-10: 2 m[IU]/min via INTRAVENOUS
  Filled 2021-08-10: qty 500

## 2021-08-10 MED ORDER — OXYTOCIN BOLUS FROM INFUSION
333.0000 mL | Freq: Once | INTRAVENOUS | Status: AC
  Administered 2021-08-11: 333 mL via INTRAVENOUS

## 2021-08-10 MED ORDER — DIPHENHYDRAMINE HCL 50 MG/ML IJ SOLN: 12.5000 mg | INTRAMUSCULAR | Status: DC | PRN

## 2021-08-10 MED ORDER — OXYTOCIN-SODIUM CHLORIDE 30-0.9 UT/500ML-% IV SOLN
2.5000 [IU]/h | INTRAVENOUS | Status: DC
  Administered 2021-08-11: 2.5 [IU]/h via INTRAVENOUS

## 2021-08-10 MED ORDER — LACTATED RINGERS IV SOLN
500.0000 mL | Freq: Once | INTRAVENOUS | Status: AC
  Administered 2021-08-11: 500 mL via INTRAVENOUS

## 2021-08-10 MED ORDER — EPHEDRINE 5 MG/ML INJ: 10.0000 mg | INTRAVENOUS | Status: DC | PRN

## 2021-08-10 MED ORDER — FENTANYL CITRATE (PF) 100 MCG/2ML IJ SOLN
50.0000 ug | INTRAMUSCULAR | Status: DC | PRN
  Administered 2021-08-10: 100 ug via INTRAVENOUS
  Administered 2021-08-10: 50 ug via INTRAVENOUS
  Administered 2021-08-11: 100 ug via INTRAVENOUS
  Filled 2021-08-10 (×3): qty 2

## 2021-08-10 MED ORDER — SOD CITRATE-CITRIC ACID 500-334 MG/5ML PO SOLN: 30.0000 mL | ORAL | Status: DC | PRN

## 2021-08-10 MED ORDER — FENTANYL-BUPIVACAINE-NACL 0.5-0.125-0.9 MG/250ML-% EP SOLN
12.0000 mL/h | EPIDURAL | Status: DC | PRN
  Administered 2021-08-11: 12 mL/h via EPIDURAL
  Filled 2021-08-10: qty 250

## 2021-08-10 MED ORDER — LIDOCAINE HCL (PF) 1 % IJ SOLN: 30.0000 mL | INTRAMUSCULAR | Status: DC | PRN

## 2021-08-10 MED ORDER — LACTATED RINGERS IV SOLN
INTRAVENOUS | Status: DC
Start: 2021-08-10 — End: 2021-08-11

## 2021-08-10 MED ORDER — ACETAMINOPHEN 325 MG PO TABS
650.0000 mg | ORAL_TABLET | ORAL | Status: DC | PRN
  Administered 2021-08-11: 650 mg via ORAL
  Filled 2021-08-10: qty 2

## 2021-08-10 MED ORDER — PHENYLEPHRINE 80 MCG/ML (10ML) SYRINGE FOR IV PUSH (FOR BLOOD PRESSURE SUPPORT)
80.0000 ug | PREFILLED_SYRINGE | INTRAVENOUS | Status: DC | PRN
  Administered 2021-08-11: 80 ug via INTRAVENOUS

## 2021-08-10 MED ORDER — MISOPROSTOL 25 MCG QUARTER TABLET: 25.0000 ug | ORAL_TABLET | ORAL | Status: DC | PRN

## 2021-08-10 MED ORDER — LACTATED RINGERS IV SOLN: 500.0000 mL | INTRAVENOUS | Status: DC | PRN

## 2021-08-10 MED ORDER — ONDANSETRON HCL 4 MG/2ML IJ SOLN
4.0000 mg | Freq: Four times a day (QID) | INTRAMUSCULAR | Status: DC | PRN
  Administered 2021-08-11: 4 mg via INTRAVENOUS
  Filled 2021-08-10: qty 2

## 2021-08-10 MED ORDER — PHENYLEPHRINE 80 MCG/ML (10ML) SYRINGE FOR IV PUSH (FOR BLOOD PRESSURE SUPPORT)
80.0000 ug | PREFILLED_SYRINGE | INTRAVENOUS | Status: DC | PRN
  Filled 2021-08-10: qty 10

## 2021-08-10 MED ORDER — TERBUTALINE SULFATE 1 MG/ML IJ SOLN: 0.2500 mg | Freq: Once | INTRAMUSCULAR | Status: DC | PRN

## 2021-08-10 NOTE — MAU Note (Signed)
Kathryn Benjamin is a 26 y.o. at [redacted]w[redacted]d here in MAU reporting: continued decreased FM, states has only moved 2-3 times today.  Denies VB or LOF.  Seen in MAU yesterday for decreased FM, POC discussed.  Plans to stay for IOL today. ? ?Onset of complaint: yesterday ?Pain score: 0 ?Vitals:  ? 08/10/21 1645  ?BP: 117/68  ?Pulse: 90  ?Resp: 20  ?Temp: 98.7 ?F (37.1 ?C)  ?SpO2: 99%  ?   ?FHT: 120 bpm ?Lab orders placed from triage: None    ?

## 2021-08-10 NOTE — H&P (Signed)
Kathryn Benjamin is a 26 y.o. female, G4P1021 at 39.1 weeks, presenting for IOL s/t DFM. Patient receives care at [facility] and was supervised for a low-risk pregnancy. Pregnancy and medical history significant for problems as listed below. She is GBS Negative and expresses a desire for epidural for pain management.  She is anticipating a female infant-Kathryn Benjamin and requests Nexplanon for PP birth control method.   ? ? ?Patient Active Problem List  ? Diagnosis Date Noted  ? Indication for care in labor or delivery 08/10/2021  ? History of migraine headaches 03/02/2021  ? Supervision of low-risk pregnancy, third trimester 01/11/2021  ? ?History of present pregnancy: ? ?Last evaluation:  Aug 10, 2021 in MAU by Robyne Askew, NP ? ? ?Nursing Staff Provider  ?Office Location [facility] Dating  [place] at [redacted]w[redacted]d  ?Language  Spanish Anatomy US  Scheduled 03/22/21  ?Flu Vaccine  Declined 02/01/21 Genetic/Carrier Screen  Panorama: negative, normal girl  ?AFP: Negative  ?Horizon: Negative   ?TDaP Vaccine  05/25/21  Hgb A1C or  ?GTT Early - nml ?Third trimester - normal  ?COVID Vaccine Has not had   LAB RESULTS   ?Rhogam  NA Blood Type --/--/O POS ?Performed at [facility], [place] [address], [place], [place] [address] ? [phone])   ?Baby Feeding Plan Both Antibody  negative  ?Contraception Nexplanon Rubella  immune  ?Circumcision NA RPR   negative  ?Pediatrician  Undecided* HBsAg   negative  ?Support Person [employer] (boyfriend/FOB) HCVAb  negative  ?Prenatal Classes  HIV Non Reactive (06/10 1834)     ?BTL Consent NA GBS  Negative  ?VBAC Consent NA Pap  normal 11/22  ?     ?DME Rx [X]  BP cuff ?[ ]  Weight Scale Waterbirth  [ ]  Class [ ]  Consent [ ]  CNM visit  ?PHQ9 & GAD7 [ X ] new OB ?[  ] 28 weeks  ?[  ] 36 weeks Induction  [ ]  Orders Entered [ ] Foley Y/N  ? ? ?OB History   ? ? Gravida  ?4  ? Para  ?1  ? Term  ?1  ? Preterm  ?0  ? AB  ?2  ? Living  ?1  ?  ? ? SAB  ?2  ? IAB  ?0  ? Ectopic  ?0  ? Multiple  ?0  ? Live Births  ?1  ?   ?   ?  ? ? ? ? ?Past Medical History:  ?Diagnosis Date  ? Anemia   ? first preg  ? ?Past Surgical History:  ?Procedure Laterality Date  ? NO PAST SURGERIES    ? ?Family History: family history includes Diabetes in her maternal uncle and mother; Healthy in her father. ?Social History:  reports that she has never smoked. She has never used smokeless tobacco. She reports that she does not currently use alcohol. She reports that she does not use drugs. ? ? ?Prenatal Transfer Tool  ?Maternal Diabetes: No ?Genetic Screening: Normal ?Maternal Ultrasounds/Referrals: Normal ?Fetal Ultrasounds or other Referrals:  None ?Maternal Substance Abuse:  No ?Significant Maternal Medications:  None ?Significant Maternal Lab Results: Group B Strep negative ? ? ?Maternal Assessment: ? ?ROS: -Contractions, -LOF, -Vaginal Bleeding, ? Occasional Fetal Movement ? ?All other systems reviewed and negative.  ? ? ?No Known Allergies ? ? ?  ?Blood pressure 117/68, pulse 90, temperature 98.7 ?F (37.1 ?C), temperature source Oral, resp. rate 20, height 5\' 1"  (1.549 m), weight 65.5 kg, last menstrual period  10/02/2020, SpO2 99 %. ? ?Physical Exam ?Vitals reviewed. Exam conducted with a chaperone present.  ?Constitutional:   ?   Appearance: Normal appearance.  ?HENT:  ?   Head: Normocephalic and atraumatic.  ?Eyes:  ?   Conjunctiva/sclera: Conjunctivae normal.  ?Cardiovascular:  ?   Rate and Rhythm: Normal rate and regular rhythm.  ?Pulmonary:  ?   Effort: Pulmonary effort is normal. No respiratory distress.  ?   Breath sounds: Normal breath sounds.  ?Abdominal:  ?   General: Bowel sounds are normal.  ?   Palpations: Abdomen is soft.  ?Genitourinary: ?   Comments: Dilation: 2 ?Effacement (%): 60 ?Station: -3 ?Presentation: Vertex ?Exam by:: J Briteny Fulghum CNM ? ?-Foley bulb placed ?Musculoskeletal:  ?   Cervical back: Normal range of motion.  ?Skin: ?   General: Skin is warm and dry.  ?Neurological:  ?   Mental Status: She is alert and oriented to person,  place, and time.  ?Psychiatric:     ?   Mood and Affect: Mood normal.     ?   Behavior: Behavior normal.  ? ? ?Fetal Assessment: ?Leopolds:  ?-Pelvis: Proven to 2948g ?-EFW: 7lbs ?-Presentation: Vertex ? ?FHR: 130 bpm, Mod Var, -Decels, +Accels ?UCs:  Occasional ? ?  ?Assessment ?IUP at 39.1 weeks ?Cat I FT ?IOL ?GBS Negative ? ? ?Plan: ?Admit to [facility]  ?Routine Labor and Delivery Orders per Protocol ?In room to complete assessment and discuss POC: ?-Discussed r/b of induction including fetal distress, serial induction, pain, and increased risk of c/s delivery. ?-Discussed induction methods including cervical ripening agents, foley bulbs, and pitocin ?-Patient verbalizes understanding and wishes to proceed with induction process ?-Discussed foley bulb insertion including r/b, placement, associated discomfort, initiation of pitocin, and removal. ?-Foley bulb placed without difficulty. Infused with 80cc ?-Start pitocin at 24mUn/min and increase to 66mUn/min and hold until foley bulb expelled.  ? ?Maryann Conners CNM, MSN ?08/10/2021, 5:10 PM ? ? ? ? ? ?

## 2021-08-11 ENCOUNTER — Inpatient Hospital Stay (HOSPITAL_COMMUNITY): Payer: Medicaid Other | Admitting: Anesthesiology

## 2021-08-11 ENCOUNTER — Encounter (HOSPITAL_COMMUNITY): Payer: Self-pay | Admitting: Obstetrics and Gynecology

## 2021-08-11 DIAGNOSIS — Z3A39 39 weeks gestation of pregnancy: Secondary | ICD-10-CM

## 2021-08-11 DIAGNOSIS — O368130 Decreased fetal movements, third trimester, not applicable or unspecified: Secondary | ICD-10-CM

## 2021-08-11 LAB — RPR: RPR Ser Ql: NONREACTIVE

## 2021-08-11 MED ORDER — IBUPROFEN 600 MG PO TABS
600.0000 mg | ORAL_TABLET | Freq: Four times a day (QID) | ORAL | Status: DC
  Administered 2021-08-11 – 2021-08-12 (×5): 600 mg via ORAL
  Filled 2021-08-11 (×5): qty 1

## 2021-08-11 MED ORDER — TETANUS-DIPHTH-ACELL PERTUSSIS 5-2.5-18.5 LF-MCG/0.5 IM SUSY: 0.5000 mL | PREFILLED_SYRINGE | Freq: Once | INTRAMUSCULAR | Status: DC

## 2021-08-11 MED ORDER — SIMETHICONE 80 MG PO CHEW
80.0000 mg | CHEWABLE_TABLET | ORAL | Status: DC | PRN
Start: 2021-08-11 — End: 2021-08-12

## 2021-08-11 MED ORDER — ACETAMINOPHEN 325 MG PO TABS
650.0000 mg | ORAL_TABLET | ORAL | Status: DC | PRN
  Administered 2021-08-11 – 2021-08-12 (×2): 650 mg via ORAL
  Filled 2021-08-11 (×2): qty 2

## 2021-08-11 MED ORDER — SODIUM CHLORIDE 0.9% FLUSH: 3.0000 mL | Freq: Two times a day (BID) | INTRAVENOUS | Status: DC

## 2021-08-11 MED ORDER — COCONUT OIL OIL: 1.0000 "application " | TOPICAL_OIL | Status: DC | PRN

## 2021-08-11 MED ORDER — PRENATAL MULTIVITAMIN CH
1.0000 | ORAL_TABLET | Freq: Every day | ORAL | Status: DC
  Administered 2021-08-11 – 2021-08-12 (×2): 1 via ORAL
  Filled 2021-08-11 (×2): qty 1

## 2021-08-11 MED ORDER — SODIUM CHLORIDE 0.9% FLUSH: 3.0000 mL | INTRAVENOUS | Status: DC | PRN

## 2021-08-11 MED ORDER — ZOLPIDEM TARTRATE 5 MG PO TABS: 5.0000 mg | ORAL_TABLET | Freq: Every evening | ORAL | Status: DC | PRN

## 2021-08-11 MED ORDER — LIDOCAINE HCL (PF) 1 % IJ SOLN
INTRAMUSCULAR | Status: DC | PRN
  Administered 2021-08-11: 8 mL via EPIDURAL

## 2021-08-11 MED ORDER — ONDANSETRON HCL 4 MG PO TABS: 4.0000 mg | ORAL_TABLET | ORAL | Status: DC | PRN

## 2021-08-11 MED ORDER — DIPHENHYDRAMINE HCL 25 MG PO CAPS: 25.0000 mg | ORAL_CAPSULE | Freq: Four times a day (QID) | ORAL | Status: DC | PRN

## 2021-08-11 MED ORDER — ONDANSETRON HCL 4 MG/2ML IJ SOLN: 4.0000 mg | INTRAMUSCULAR | Status: DC | PRN

## 2021-08-11 MED ORDER — DIBUCAINE (PERIANAL) 1 % EX OINT: 1.0000 "application " | TOPICAL_OINTMENT | CUTANEOUS | Status: DC | PRN

## 2021-08-11 MED ORDER — WITCH HAZEL-GLYCERIN EX PADS: 1.0000 "application " | MEDICATED_PAD | CUTANEOUS | Status: DC | PRN

## 2021-08-11 MED ORDER — SENNOSIDES-DOCUSATE SODIUM 8.6-50 MG PO TABS
2.0000 | ORAL_TABLET | ORAL | Status: DC
  Administered 2021-08-11 – 2021-08-12 (×2): 2 via ORAL
  Filled 2021-08-11 (×2): qty 2

## 2021-08-11 MED ORDER — MEASLES, MUMPS & RUBELLA VAC IJ SOLR: 0.5000 mL | Freq: Once | INTRAMUSCULAR | Status: DC

## 2021-08-11 MED ORDER — SODIUM CHLORIDE 0.9 % IV SOLN: 250.0000 mL | INTRAVENOUS | Status: DC | PRN

## 2021-08-11 MED ORDER — BENZOCAINE-MENTHOL 20-0.5 % EX AERO: 1.0000 "application " | INHALATION_SPRAY | CUTANEOUS | Status: DC | PRN

## 2021-08-11 NOTE — Progress Notes (Signed)
Labor Progress Note ?Aubrynn Katona is a 26 y.o. [phone] at [redacted]w[redacted]d presented for IOL d/t DFM.  ? ?S: Doing well, trying to rest. FB fell out about 1 hr ago.  ? ?O:  ?BP 112/64   Pulse 67   Temp (!) 97.5 ?F (36.4 ?C) (Oral)   Resp 17   Ht 5\' 1"  (1.549 m)   Wt 65.5 kg   LMP 10/02/2020 (Approximate)   SpO2 99%   BMI 27.30 kg/m?  ?EFM: 120/mod/15x15/none ?Baseline ranges from 115-130  ? ?CVE: Dilation: 4 ?Effacement (%): 70 ?Cervical Position: Anterior ?Station: -2 ?Presentation: Vertex ?Exam by:: Dr. [email or web address] ? ? ?A&P: 26 y.o. 34 [redacted]w[redacted]d  ?#Labor: Progressing well s/p FB. Cervical exam challenging with anterior placement but far back in canal. Head is not well applied. Will increase pit 2x2 and recheck in the next 2-3 hours for hopeful AROM at that time.  ?#Pain: PRN  ?#FWB: Cat I currently ?#GBS negative ? ?[redacted]w[redacted]d, DO ?2:52 AM  ?

## 2021-08-11 NOTE — Discharge Summary (Signed)
? ?  Postpartum Discharge Summary ? ?   ?Patient Name: Kathryn Benjamin ?DOB: 1995/09/05 ?MRN: [id number] ? ?Date of admission: 08/10/2021 ?Delivery date:08/11/2021  ?Delivering provider: Patriciaann Clan  ?Date of discharge: 08/12/2021 ? ?Admitting diagnosis: Indication for care in labor or delivery [O75.9] ?Intrauterine pregnancy: [redacted]w[redacted]d    ?Secondary diagnosis:  Principal Problem: ?  Indication for care in labor or delivery ? ?Additional problems: N/A     ?Discharge diagnosis: Term Pregnancy Delivered                                              ?Post partum procedures: N/A  ?Augmentation: Pitocin and IP Foley ?Complications: None ? ?Hospital course: Induction of Labor With Vaginal Delivery   ?26y.o. yo [id number] 355w2das admitted to the hospital 08/10/2021 for induction of labor.  Indication for induction:  DFM .  Patient had an uncomplicated labor course as follows: ?Membrane Rupture Time/Date: 4:32 AM ,08/11/2021   ?Delivery Method:Vaginal, Spontaneous  ?Episiotomy: None  ?Lacerations:  None  ?Details of delivery can be found in separate delivery note.  Patient had a routine postpartum course. Patient is discharged home 08/12/21. ? ?Newborn Data: ?Birth date:08/11/2021  ?Birth time:4:49 AM  ?Gender:Female  ?Living status:Living  ?Apgars:8 ,9  ?Weight:3000 g  ? ?Magnesium Sulfate received: No ?BMZ received: No ?Rhophylac:No ?MMR:No ?T-DaP:Given prenatally ?Flu: No ?Transfusion:No ? ? ? ?Physical exam  ?Vitals:  ? 08/11/21 1146 08/11/21 1625 08/11/21 2105 08/12/21 0620  ?BP: 121/80 113/65 (!) 110/59 104/61  ?Pulse: 63 64 72 70  ?Resp: _0 ?Temp: 97.8 ?F (36.6 ?C) 98 ?F (36.7 ?C) 97.7 ?F (36.5 ?C) 98 ?F (36.7 ?C)  ?TempSrc: Oral Oral Oral Oral  ?SpO2:  100% 100% 100%  ?Weight:      ?Height:      ? ?General: alert, cooperative, and no distress ?Lochia: appropriate for PPD#1 ?Uterine Fundus: firm midline below umbilicus  ?Incision: N/A ?DVT Evaluation: No evidence of DVT seen on physical exam. ?Labs: ?Lab  Results  ?Component Value Date  ? WBC 8.6 08/12/2021  ? HGB 10.0 (L) 08/12/2021  ? HCT 30.0 (L) 08/12/2021  ? MCV 88.0 08/12/2021  ? PLT 178 08/12/2021  ? ? ?  Latest Ref Rng & Units 10/24/2020  ?  3:13 PM  ?CMP  ?Glucose 70 - 99 mg/dL 94    ?BUN 6 - 20 mg/dL 12    ?Creatinine 0.44 - 1.00 mg/dL 0.78    ?Sodium 135 - 145 mmol/L 135    ?Potassium 3.5 - 5.1 mmol/L 3.5    ?Chloride 98 - 111 mmol/L 102    ?CO2 22 - 32 mmol/L 23    ?Calcium 8.9 - 10.3 mg/dL 9.3    ?Total Protein 6.5 - 8.1 g/dL 7.3    ?Total Bilirubin 0.3 - 1.2 mg/dL 0.9    ?Alkaline Phos 38 - 126 U/L 50    ?AST 15 - 41 U/L 18    ?ALT 0 - 44 U/L 18    ? ?Edinburgh Score: ? ?  08/12/2021  ?  8:20 AM  ?EdFlavia Shipperostnatal Depression Scale Screening Tool  ?I have been able to laugh and see the funny side of things. 0  ?I have looked forward with enjoyment to things. 0  ?I have blamed myself unnecessarily when things went wrong. 1  ?I have  been anxious or worried for no good reason. 0  ?I have felt scared or panicky for no good reason. 0  ?Things have been getting on top of me. 0  ?I have been so unhappy that I have had difficulty sleeping. 0  ?I have felt sad or miserable. 1  ?I have been so unhappy that I have been crying. 1  ?The thought of harming myself has occurred to me. 0  ?Edinburgh Postnatal Depression Scale Total 3  ? ? ? ?After visit meds:  ?Allergies as of 08/12/2021   ?No Known Allergies ?  ? ?  ?Medication List  ?  ? ?STOP taking these medications   ? ?Magnesium Oxide 200 MG Tabs ?Commonly known as: Mag-Oxide ?  ?prenatal vitamin w/FE, FA 27-1 MG Tabs tablet ?  ? ?  ? ?TAKE these medications   ? ?acetaminophen 325 MG tablet ?Commonly known as: Tylenol ?Take 2 tablets (650 mg total) by mouth every 4 (four) hours as needed (for pain scale < 4). ?What changed:  ?when to take this ?reasons to take this ?  ?ibuprofen 600 MG tablet ?Commonly known as: ADVIL ?Take 1 tablet (600 mg total) by mouth every 6 (six) hours. ?  ? ?  ? ? ? ?Pt desires to go home  to her other child at home. Pt stable and pain being managed with PO medications. Ambulation encouraged to help with gut motility. Warm sitz baths encouraged if needed. Return Precautions reviewed.   ? ?Discharge home in stable condition ?Infant Feeding: Bottle and Breast ?Infant Disposition:home with mother ?Discharge instruction: per After Visit Summary and Postpartum booklet. ?Activity: Advance as tolerated. Pelvic rest for 6 weeks.  ?Diet: routine diet ?Future Appointments: ?Future Appointments  ?Date Time Provider [place]  ?09/22/2021 10:15 AM Woodroe Mode, MD [facility]  ? ?Follow up Visit: ? ?Message sent to [facility] by Dr Higinio Plan  ?Please schedule this patient for a In person postpartum visit in 6 weeks with the following provider: Any provider. ?Additional Postpartum F/U: None   ?Low risk pregnancy complicated by:  DFM ?Delivery mode:  Vaginal, Spontaneous  ?Anticipated Birth Control:  Nexplanon (desires at postpartum visit)  ? ?Arti Trang Isaias Sakai) Rollene Rotunda, MSN, CNM  ?[facility] [place]  ?08/12/21 2:26 PM  ? ? ? ?

## 2021-08-11 NOTE — Lactation Note (Signed)
This note was copied from a baby's chart. ?Lactation Consultation Note ? ?Patient Name: Kathryn Benjamin ?Today's Date: 08/11/2021 ?Reason for consult: Initial assessment;Term ?Age:27 hours ? ?LC in to visit with P2 Mom of term baby delivered vaginally.  Mom reports feeling nauseated and vomiting after delivery and wasn't able to latch baby to the breast.  Baby remained STS with Mom and baby was noted to be showing feeding cues, but wasn't given help with latch in L&D.  When Mom came to [facility], her nurse assisted, but baby was sleepy and didn't show any interest. ? ?LC encouraged baby to remain STS until she is consistently feeding.  FOB holding baby swaddled.  Mom agreed to learn hand expression, unable to express any drops.  Spoon provided to use to spoon feed drops of colostrum to baby.  Mom winced with LC demonstrating hand expression.  Encouraged Mom to do massage and expression to stimulate her milk supply.  Nipples semi-flat and areola compressible.  Reviewed basic of a good latch with Mom. ? ?FOB states he will put baby STS when baby starts to awaken.  Reminded parents that STS will encouraged more feedings.  ? ?Mom aware of IP and OP lactation support available to her.  Encouraged Mom to ask for help prn. ? ?Maternal Data ?Has patient been taught Hand Expression?: Yes ?Does the patient have breastfeeding experience prior to this delivery?: Yes ?How long did the patient breastfeed?: 1 year ? ?Feeding ?Mother's Current Feeding Choice: Breast Milk and Formula ?Interventions ?Interventions: Breast feeding basics reviewed;Skin to skin;Breast massage;Hand express ? ?Discharge ?Pump: Personal (Spectra DEBP from insurance) ? ?Consult Status ?Consult Status: Follow-up ?Date: 08/12/21 ?Follow-up type: In-patient ? ? ? ?Johny Blamer E ?08/11/2021, 10:52 AM ? ? ? ?

## 2021-08-11 NOTE — Progress Notes (Signed)
Labor Progress Note ?Kathryn Benjamin is a 26 y.o. [phone] at [redacted]w[redacted]d presented for IOL due to DFM.  ? ?S: Doing well. FB still in place. Currently comfortable after IV fent.  ? ?O:  ?BP 115/66   Pulse 68   Temp (!) 97.4 ?F (36.3 ?C) (Oral)   Resp 19   Ht 5\' 1"  (1.549 m)   Wt 65.5 kg   LMP 10/02/2020 (Approximate)   SpO2 99%   BMI 27.30 kg/m?  ?EFM: 120/mod/15x15/none ? ?CVE: Dilation: 2 ?Effacement (%): 60 ?Station: -3 ?Presentation: Vertex ?Exam by:: Kathryn Benjamin CNM ? ? ?A&P: 26 y.o. [id number] [redacted]w[redacted]d  ?#Labor: FB still in place but feeling some more give per RN. Plan to do digital exam in the next 2 or so hours if still in. Cont pit as is.  ?#Pain: PRN  ?#FWB: Cat I  ?#GBS negative ? ?Patriciaann Clan, DO ?12:21 AM  ?

## 2021-08-11 NOTE — Anesthesia Postprocedure Evaluation (Signed)
Anesthesia Post Note ? ?Patient: Tonnia Bardin ? ?Procedure(s) Performed: AN AD HOC LABOR EPIDURAL ? ?  ? ?Patient location during evaluation: Mother Baby ?Anesthesia Type: Epidural ?Level of consciousness: awake ?Pain management: satisfactory to patient ?Vital Signs Assessment: post-procedure vital signs reviewed and stable ?Respiratory status: spontaneous breathing ?Cardiovascular status: stable ?Anesthetic complications: no ? ? ?No notable events documented. ? ?Last Vitals:  ?Vitals:  ? 08/11/21 0800 08/11/21 1146  ?BP: 116/66 121/80  ?Pulse: 64 63  ?Resp: 16 18  ?Temp: 36.5 ?C 36.6 ?C  ?SpO2: 100%   ?  ?Last Pain:  ?Vitals:  ? 08/11/21 1146  ?TempSrc: Oral  ?PainSc: 0-No pain  ? ?Pain Goal:   ? ?  ?  ?  ?  ?  ?  ?  ? ?Marylynne Keelin ? ? ? ? ?

## 2021-08-11 NOTE — Lactation Note (Addendum)
This note was copied from a baby's chart. ?Lactation Consultation Note ? ?Patient Name: Kathryn Benjamin ?Today's Date: 08/11/2021 ?Reason for consult: Follow-up assessment;Term ?Age:26 hours ?Current feeding choice is:  breast feeding and supplementing with donor breast milk. ?Per parents infant has not BF since birth at 12 hours, been sleepy, has not latched since birth and mom has not been hand expressing. ?LC gave mom hand pump with 21 breast flange to pre-pump breast prior to latching infant see latch score below for nipple type. ?Mom attempted to latch infant but infant was sleepy and would not latch at this time. ?LC reviewed hand expression using the breast model , mom self expressed but stopped due sensitive breast and pain. ?Infant was given 8 mls of donor breast milk using slow flow ( yellow) nipple. ?If infant continues not to latch she will need be set up with DEBP, LC follow up later today with RN. ?Mom's plan: ?1- Mom will continue to BF by hunger cues, on demand, 8 to 12+ or more times within 24 hours, skin to skin. ?2- Mom will continue to work towards latching infant at the breast and will ask RN/LC for further latch assistance if needed. ?3- Mom will pre-pump prior to latching infant at the breast. ?4- If infant is still not latching she will continue to offer infant donor breast milk. ?Maternal Data ?Has patient been taught Hand Expression?: Yes ?Does the patient have breastfeeding experience prior to this delivery?: Yes ?How long did the patient breastfeed?: Briefly BF her 2nd child ? ?Feeding ?Mother's Current Feeding Choice: Breast Milk and Donor Milk ? ?LATCH Score ?Latch: Too sleepy or reluctant, no latch achieved, no sucking elicited. ? ?Audible Swallowing: None ? ?Type of Nipple: Flat ? ?Comfort (Breast/Nipple): Soft / non-tender ? ?Hold (Positioning): Assistance needed to correctly position infant at breast and maintain latch. ? ?LATCH Score: 4 ? ? ?Lactation Tools  Discussed/Used ?Tools: Pump;Flanges ?Flange Size: 21 ?Breast pump type: Manual ?Pump Education: Setup, frequency, and cleaning ?Pumping frequency: Mom will pre-pump breast prior to latching infant. ? ?Interventions ?Interventions: Breast feeding basics reviewed;Assisted with latch;Skin to skin;Pre-pump if needed;Breast compression;Adjust position;Support pillows;Position options;Expressed milk;Hand pump;Education;LC Services brochure ? ?Discharge ?  ? ?Consult Status ?Consult Status: Follow-up ?Date: 08/12/21 ?Follow-up type: In-patient ? ? ? ?Danelle Earthly ?08/11/2021, 5:25 PM ? ? ? ?

## 2021-08-11 NOTE — Anesthesia Procedure Notes (Signed)
Epidural ?Patient location during procedure: OB ?Start time: 08/11/2021 3:39 AM ?End time: 08/11/2021 3:43 AM ? ?Staffing ?Anesthesiologist: Janeece Riggers, MD ? ?Preanesthetic Checklist ?Completed: patient identified, IV checked, site marked, risks and benefits discussed, surgical consent, monitors and equipment checked, pre-op evaluation and timeout performed ? ?Epidural ?Patient position: sitting ?Prep: DuraPrep and site prepped and draped ?Patient monitoring: continuous pulse ox and blood pressure ?Approach: midline ?Location: L3-L4 ?Injection technique: LOR air ? ?Needle:  ?Needle type: Tuohy  ?Needle gauge: 17 G ?Needle length: 9 cm and 9 ?Needle insertion depth: 6 cm ?Catheter type: closed end flexible ?Catheter size: 19 Gauge ?Catheter at skin depth: 12 cm ?Test dose: negative ? ?Assessment ?Events: blood not aspirated, injection not painful, no injection resistance, no paresthesia and negative IV test ? ? ? ?

## 2021-08-11 NOTE — Anesthesia Preprocedure Evaluation (Signed)
Anesthesia Evaluation  ?Patient identified by MRN, date of birth, ID band ?Patient awake ? ? ? ?Reviewed: ?Allergy & Precautions, H&P , NPO status , Patient's Chart, lab work & pertinent test results, reviewed documented beta blocker date and time  ? ?Airway ?Mallampati: III ? ?TM Distance: >3 FB ?Neck ROM: full ? ? ? Dental ?no notable dental hx. ?(+) Teeth Intact, Dental Advisory Given ?  ?Pulmonary ?neg pulmonary ROS,  ?  ?Pulmonary exam normal ?breath sounds clear to auscultation ? ? ? ? ? ? Cardiovascular ?negative cardio ROS ?Normal cardiovascular exam ?Rhythm:regular Rate:Normal ? ? ?  ?Neuro/Psych ?negative neurological ROS ? negative psych ROS  ? GI/Hepatic ?negative GI ROS, Neg liver ROS,   ?Endo/Other  ?negative endocrine ROS ? Renal/GU ?negative Renal ROS  ?negative genitourinary ?  ?Musculoskeletal ? ? Abdominal ?  ?Peds ? Hematology ? ?(+) Blood dyscrasia, anemia ,   ?Anesthesia Other Findings ? ? Reproductive/Obstetrics ?(+) Pregnancy ? ?  ? ? ? ? ? ? ? ? ? ? ? ? ? ?  ?  ? ? ? ? ? ? ? ? ?Anesthesia Physical ?Anesthesia Plan ? ?ASA: 2 ? ?Anesthesia Plan: Epidural  ? ?Post-op Pain Management:   ? ?Induction:  ? ?PONV Risk Score and Plan:  ? ?Airway Management Planned:  ? ?Additional Equipment:  ? ?Intra-op Plan:  ? ?Post-operative Plan:  ? ?Informed Consent: I have reviewed the patients History and Physical, chart, labs and discussed the procedure including the risks, benefits and alternatives for the proposed anesthesia with the patient or authorized representative who has indicated his/her understanding and acceptance.  ? ? ? ?Dental Advisory Given ? ?Plan Discussed with:  ? ?Anesthesia Plan Comments: (Labs checked- platelets confirmed with RN in room. Fetal heart tracing, per RN, reported to be stable enough for sitting procedure. ?Discussed epidural, and patient consents to the procedure:  included risk of possible headache,backache, failed block, allergic reaction,  and nerve injury. This patient was asked if she had any questions or concerns before the procedure started.)  ? ? ? ? ? ? ?Anesthesia Quick Evaluation ? ?

## 2021-08-12 LAB — CBC
HCT: 30 % — ABNORMAL LOW (ref 36.0–46.0)
Hemoglobin: 10 g/dL — ABNORMAL LOW (ref 12.0–15.0)
MCH: 29.3 pg (ref 26.0–34.0)
MCHC: 33.3 g/dL (ref 30.0–36.0)
MCV: 88 fL (ref 80.0–100.0)
Platelets: 178 10*3/uL (ref 150–400)
RBC: 3.41 MIL/uL — ABNORMAL LOW (ref 3.87–5.11)
RDW: 14.5 % (ref 11.5–15.5)
WBC: 8.6 10*3/uL (ref 4.0–10.5)
nRBC: 0 % (ref 0.0–0.2)

## 2021-08-12 MED ORDER — IBUPROFEN 600 MG PO TABS: 600.0000 mg | ORAL_TABLET | Freq: Four times a day (QID) | ORAL | 0 refills | Status: DC

## 2021-08-12 MED ORDER — ACETAMINOPHEN 325 MG PO TABS: 650.0000 mg | ORAL_TABLET | ORAL | 0 refills | Status: DC | PRN

## 2021-08-12 NOTE — Lactation Note (Signed)
This note was copied from a baby's chart. ?Lactation Consultation Note ? ?Patient Name: Kathryn Benjamin ?Today's Date: 08/12/2021 ?Reason for consult: Follow-up assessment;Difficult latch;Term ?Age:27 hours ? ? ?Mom states infant will not suck when latching but thrusts the nipple out.   ?Upon suck assessment; infant could not grasp and begin sucking easily.   ?Palate is narrow and high, infant clamped down on gloved finger. ?LC did suck training and infant did begin sucking but gumline was felt and tongue didn't extend.   ?Dad states bottle feeding took 1 hour last time the bottle was offered. ? ?Infant placed STS, rooting noted, infant unable to latch with LC assistance.  NS 20 placed.  Infant did not easily suck at first but did get into a rhythmic pattern but latch was shallow.   ?LC bottle fed, 20 ml.  Paced bottle feeding with wide mouth and flanged lips.  Infant fed well without any distress during feeding, no leaking.   ? ?Plan: ?Pump every 3 hours or around the time infant receives a bottle.   ?Bottle feed using paced feeding.   ?Offer the breast prior to bottle feeding; if unable to latch, bottle feed. ?Follow up with LC OP.  Epic Message sent.   ? ?Maternal Data ?Does the patient have breastfeeding experience prior to this delivery?: Yes ?How long did the patient breastfeed?: mom states she BF her first child for a year, no issues, milk volume came in after 1 week. ? ?Feeding ?Mother's Current Feeding Choice: Breast Milk and Donor Milk ?Nipple Type: Slow - flow ? ?LATCH Score ?Latch: Too sleepy or reluctant, no latch achieved, no sucking elicited. ? ?Audible Swallowing: None ? ?Type of Nipple: Flat ? ?Comfort (Breast/Nipple): Soft / non-tender ? ?Hold (Positioning): Assistance needed to correctly position infant at breast and maintain latch. ? ?LATCH Score: 4 ? ? ?Lactation Tools Discussed/Used ?Breast pump type: Double-Electric Breast Pump ?Reason for Pumping: stimulate supply/ not latching;  encouraged every 3 hours ?Pumping frequency: encouraged q3 ? ?Interventions ?Interventions: Breast feeding basics reviewed;Assisted with latch;Skin to skin;Breast massage;Hand express;Support pillows;Education;LC Services brochure ? ?Discharge ?Discharge Education: Outpatient recommendation;Outpatient Epic message sent ?Pump: DEBP;Personal (mom has not received pump yet: should be delivered today) ? ?Consult Status ?Consult Status: Follow-up ?Date: 08/13/21 ?Follow-up type: In-patient ? ? ? ?Kathryn Benjamin ?08/12/2021, 1:59 PM ? ? ? ?

## 2021-08-13 ENCOUNTER — Encounter: Payer: Medicaid Other | Admitting: Obstetrics and Gynecology

## 2021-08-13 LAB — BIRTH TISSUE RECOVERY COLLECTION (PLACENTA DONATION)

## 2021-08-18 ENCOUNTER — Encounter: Payer: Medicaid Other | Admitting: Family Medicine

## 2021-08-19 ENCOUNTER — Telehealth (HOSPITAL_COMMUNITY): Payer: Self-pay | Admitting: *Deleted

## 2021-08-19 NOTE — Telephone Encounter (Signed)
Attempted hospital discharge follow-up call. Left message for patient to return RN call. Deforest Hoyles, RN, 08/19/21, [phone].

## 2021-09-21 NOTE — Progress Notes (Unsigned)
    Post Partum Visit Note  Kathryn Benjamin is a 26 y.o. [phone] female who presents for a postpartum visit. She is 6 weeks postpartum following a normal spontaneous vaginal delivery.  I have fully reviewed the prenatal and intrapartum course. The delivery was at 39.2 gestational weeks.  Anesthesia: epidural. Postpartum course has been good. Baby is doing well. Baby is feeding by both breast and bottle - Gerber Gentle . Bleeding no bleeding. Bowel function is normal. Bladder function is normal. Patient is not sexually active. Contraception method is none. Postpartum depression screening: negative.   The pregnancy intention screening data noted above was reviewed. Potential methods of contraception were discussed. The patient elected to proceed with No data recorded.    There are no preventive care reminders to display for this patient.  The following portions of the patient's history were reviewed and updated as appropriate: allergies, current medications, past family history, past medical history, past social history, past surgical history, and problem list.  Review of Systems Pertinent items are noted in HPI.  Objective:  LMP 10/02/2020 (Approximate)    General:  alert, cooperative, and no distress   Breasts:  not indicated  Lungs:   Heart:    Abdomen: soft, non-tender; bowel sounds normal; no masses,  no organomegaly   Wound   GU exam:  not indicated       Assessment:    There are no diagnoses linked to this encounter.  normal postpartum exam.   Plan:   Essential components of care per ACOG recommendations:  1.  Mood and well being: Patient with negative depression screening today. Reviewed local resources for support.  - Patient tobacco use? No.   - hx of drug use? No.    2. Infant care and feeding:  -Patient currently breastmilk feeding? Yes. Reviewed importance of draining breast regularly to support lactation.  -Social determinants of health (SDOH) reviewed  in EPIC. No concerns  3. Sexuality, contraception and birth spacing - Patient does not want a pregnancy in the next year.  Desired family size is 2 children.  - Reviewed reproductive life planning. Reviewed contraceptive methods based on pt preferences and effectiveness.  Patient desired Hormonal Implant today.   - Discussed birth spacing of 18 months  4. Sleep and fatigue -Encouraged family/partner/community support of 4 hrs of uninterrupted sleep to help with mood and fatigue  5. Physical Recovery  - Discussed patients delivery and complications. She describes her labor as good. - Patient had a Vaginal, no problems at delivery. Patient had a  small labial  laceration. Perineal healing reviewed. Patient expressed understanding - Patient has urinary incontinence? No. - Patient is safe to resume physical and sexual activity  6.  Health Maintenance - HM due items addressed No -   - Last pap smear  Diagnosis  Date Value Ref Range Status  02/01/2021   Final   - Negative for intraepithelial lesion or malignancy (NILM)   Pap smear not done at today's visit.  -Breast Cancer screening indicated? No.   7. Chronic Disease/Pregnancy Condition follow up: None  - PCP follow up  Adam Phenix, MD  Center for [facility], [facility]

## 2021-09-22 ENCOUNTER — Ambulatory Visit (INDEPENDENT_AMBULATORY_CARE_PROVIDER_SITE_OTHER): Payer: Medicaid Other | Admitting: Obstetrics & Gynecology

## 2021-09-22 ENCOUNTER — Other Ambulatory Visit: Payer: Self-pay

## 2021-09-22 VITALS — BP 110/74 | HR 79 | Wt 132.5 lb

## 2021-09-22 DIAGNOSIS — Z30017 Encounter for initial prescription of implantable subdermal contraceptive: Secondary | ICD-10-CM

## 2021-09-22 DIAGNOSIS — O924 Hypogalactia: Secondary | ICD-10-CM

## 2021-09-22 MED ORDER — ETONOGESTREL 68 MG ~~LOC~~ IMPL
68.0000 mg | DRUG_IMPLANT | Freq: Once | SUBCUTANEOUS | Status: AC
  Administered 2021-09-22: 68 mg via SUBCUTANEOUS

## 2021-09-22 NOTE — Progress Notes (Unsigned)
GYNECOLOGY OFFICE PROCEDURE NOTE  Kathryn Benjamin is a 26 y.o. [phone] here for Nexplanon insertion.  Last pap smear was on 01/2021 and was normal.  No other gynecologic concerns.  Nexplanon Insertion Procedure Patient identified, informed consent performed, consent signed.   Patient does understand that irregular bleeding is a very common side effect of this medication. She was advised to have backup contraception for one week after placement. Pregnancy test in clinic today was not indicated, she has abstained in the postpartum period  Appropriate time out taken.  Patient's left arm was prepped and draped in the usual sterile fashion. The ruler used to measure and mark insertion area.  Patient was prepped with alcohol swab and then injected with 3 ml of 1% lidocaine.  She was prepped with betadine, Nexplanon removed from packaging,  Device confirmed in needle, then inserted full length of needle and withdrawn per handbook instructions. Nexplanon was able to palpated in the patient's arm; patient palpated the insert herself. There was minimal blood loss.  Patient insertion site covered with gauze and a pressure bandage to reduce any bruising.  The patient tolerated the procedure well and was given post procedure instructions.      Adam Phenix, MD Attending Obstetrician & Gynecologist, [facility]  09/22/2021

## 2021-09-24 ENCOUNTER — Encounter: Payer: Self-pay | Admitting: Obstetrics & Gynecology

## 2021-11-10 ENCOUNTER — Encounter: Payer: Self-pay | Admitting: Obstetrics & Gynecology

## 2022-08-11 ENCOUNTER — Ambulatory Visit (INDEPENDENT_AMBULATORY_CARE_PROVIDER_SITE_OTHER): Payer: Medicaid Other | Admitting: Family Medicine

## 2022-08-11 ENCOUNTER — Encounter: Payer: Self-pay | Admitting: Family Medicine

## 2022-08-11 ENCOUNTER — Other Ambulatory Visit: Payer: Self-pay

## 2022-08-11 VITALS — BP 119/73 | HR 79 | Temp 98.1°F | Wt 154.4 lb

## 2022-08-11 DIAGNOSIS — Z3046 Encounter for surveillance of implantable subdermal contraceptive: Secondary | ICD-10-CM | POA: Diagnosis not present

## 2022-08-11 MED ORDER — NORGESTIMATE-ETH ESTRADIOL 0.25-35 MG-MCG PO TABS: 1.0000 | ORAL_TABLET | Freq: Every day | ORAL | 11 refills | Status: DC

## 2022-08-11 NOTE — Assessment & Plan Note (Signed)
Nexplanon removed without difficulty or complications.  Patient would like to restart combined OCPs.  Prescription for Sprintec sent to patient's pharmacy.  No further questions or concerns.

## 2022-08-11 NOTE — Progress Notes (Signed)
    SUBJECTIVE:   CHIEF COMPLAINT / HPI:   Nexplanon removal Patient had Nexplanon placed approximately 1 year ago.  She reports that over the last few weeks it has been really irritating her arm and has been extremely uncomfortable and she would like it removed.  Patient reports that she feels like it is swelling in the location hurts to rest her arm by her side.  She reports that she would like to start taking OCPs again.  Reports that she was on OCPs prior to the Nexplanon and had good results.  Has not had a period since having a Nexplanon placed.  Denies any personal or family history of blood clots or cancers.  PERTINENT  PMH / PSH: Nexplanon in place  OBJECTIVE:   BP 119/73   Pulse 79   Temp 98.1 F (36.7 C)   Wt 154 lb 6.4 oz (70 kg)   SpO2 98%   Breastfeeding No   BMI 29.17 kg/m   General: Well-appearing 27 year old female in no acute distress Cardiac: Regular rate Respiratory: Normal work of breathing, speaking in full sentences MSK: Nexplanon palpable in left arm, no noticeable erythema, edema.  Patient does report is tender to palpation  Nexplanon Removal Patient identified, informed consent performed, consent signed.   Appropriate time out taken. Nexplanon site identified.  Area prepped in usual sterile fashon. One ml of 1% lidocaine was used to anesthetize the area at the distal end of the implant. A small stab incision was made right beside the implant on the distal portion.  The Nexplanon rod was grasped using hemostats and removed without difficulty.  There was minimal blood loss. There were no complications. Steri-strips were applied over the small incision.  A pressure bandage was applied to reduce any bruising.  The patient tolerated the procedure well and was given post procedure instructions.  Patient is planning to use OCPs for contraception/attempt conception.  ASSESSMENT/PLAN:   Encounter for Nexplanon removal Nexplanon removed without difficulty or  complications.  Patient would like to restart combined OCPs.  Prescription for Sprintec sent to patient's pharmacy.  No further questions or concerns.     Celedonio Savage, MD OB Fellow

## 2023-07-10 ENCOUNTER — Other Ambulatory Visit: Payer: Self-pay | Admitting: Family Medicine

## 2023-07-11 ENCOUNTER — Other Ambulatory Visit: Payer: Self-pay | Admitting: Family Medicine

## 2023-09-07 DIAGNOSIS — Z0000 Encounter for general adult medical examination without abnormal findings: Secondary | ICD-10-CM | POA: Insufficient documentation

## 2023-09-07 NOTE — Progress Notes (Unsigned)
 Subjective:     Patient ID: Kathryn Benjamin, female    DOB: [birth year], 28 y.o.   MRN: [id number]  No chief complaint on file.   HPI  Kathryn Benjamin is a 28 year old female who presents to establish care.  Is scheduled to have salpingectomy laparoscopic on September 29, 2023   PMHx:*** (PMH:  CA, CVD, HTN, CVA, DM, ETOH, Thyroid)***  Records Obtained: Yes OR No records today, In process***  Prior PCP:  PSx-( Tubal Lititagion, Cholesctectmy, appendectomy)?  Specialists:  Meds:   Taking *** for ***  Compliance: Patient states they are/***are not compliant with medications.  FH: CA, MI/CAD, HTN, CVA, DM, ETOH, Mental Illness, Thyroid Dz  SH:  Employed:         Employer:  Relationship:         Sexual partners:  Resides with:  Education: (highest level):  Habits: ETOH:  Y/N   # Drinks per week         SA: Yes/ N/A ***            Smoking:  (Y/N)  ***PPD  Allergies:  Refills?  Patient denies fever, chills, SOB, CP, palpitations, dyspnea, edema, HA, vision changes, N/V/D, abdominal pain, urinary symptoms, rash, weight changes, and recent illness or hospitalizations.    History of Present Illness              There are no preventive care reminders to display for this patient.  Past Medical History:  Diagnosis Date   Anemia    first preg    Past Surgical History:  Procedure Laterality Date   NO PAST SURGERIES      Family History  Problem Relation Age of Onset   Diabetes Mother    Healthy Father    Diabetes Maternal Uncle     Social History   Socioeconomic History   Marital status: Single    Spouse name: Not on file   Number of children: Not on file   Years of education: Not on file   Highest education level: 10th grade  Occupational History   Not on file  Tobacco Use   Smoking status: Never   Smokeless tobacco: Never  Vaping Use   Vaping status: Never Used  Substance and Sexual Activity   Alcohol use: Not Currently   Drug use:  Never   Sexual activity: Yes  Other Topics Concern   Not on file  Social History Narrative   Not on file   Social Drivers of Health   Financial Resource Strain: Low Risk  (09/05/2023)   Overall Financial Resource Strain (CARDIA)    Difficulty of Paying Living Expenses: Not hard at all  Food Insecurity: No Food Insecurity (09/05/2023)   Hunger Vital Sign    Worried About Running Out of Food in the Last Year: Never true    Ran Out of Food in the Last Year: Never true  Transportation Needs: No Transportation Needs (09/05/2023)   PRAPARE - Administrator, Civil Service (Medical): No    Lack of Transportation (Non-Medical): No  Physical Activity: Unknown (09/05/2023)   Exercise Vital Sign    Days of Exercise per Week: 0 days    Minutes of Exercise per Session: Not on file  Stress: No Stress Concern Present (09/05/2023)   [employer] of Occupational Health - Occupational Stress Questionnaire    Feeling of Stress : Not at all  Social Connections: Socially Isolated (09/05/2023)   Social Connection and Isolation Panel [NHANES]  Frequency of Communication with Friends and Family: Once a week    Frequency of Social Gatherings with Friends and Family: Never    Attends Religious Services: Never    Database administrator or Organizations: No    Attends Engineer, structural: Not on file    Marital Status: Living with partner  Intimate Partner Violence: Not At Risk (07/24/2023)   Received from [facility]   HITS    Over the last 12 months how often did your partner physically hurt you?: Never    Over the last 12 months how often did your partner insult you or talk down to you?: Never    Over the last 12 months how often did your partner threaten you with physical harm?: Never    Over the last 12 months how often did your partner scream or curse at you?: Never    Outpatient Medications Prior to Visit  Medication Sig Dispense Refill   acetaminophen  (TYLENOL ) 325 MG tablet  Take 2 tablets (650 mg total) by mouth every 4 (four) hours as needed (for pain scale < 4). (Patient not taking: Reported on 09/22/2021) 60 tablet 0   ibuprofen  (ADVIL ) 600 MG tablet Take 1 tablet (600 mg total) by mouth every 6 (six) hours. 30 tablet 0   norgestimate -ethinyl estradiol  (ORTHO-CYCLEN) 0.25-35 MG-MCG tablet Take 1 tablet by mouth daily. 28 tablet 11   No facility-administered medications prior to visit.    No Known Allergies  ROS     Objective:     Physical Exam Constitutional:      General: She is not in acute distress.    Appearance: She is not ill-appearing, toxic-appearing or diaphoretic.  HENT:     Head: Normocephalic and atraumatic.     Right Ear: Tympanic membrane, ear canal and external ear normal.     Left Ear: Tympanic membrane, ear canal and external ear normal.     Nose: Nose normal.     Mouth/Throat:     Mouth: Mucous membranes are moist.  Eyes:     Extraocular Movements: Extraocular movements intact.     Right eye: Normal extraocular motion.     Left eye: Normal extraocular motion.     Conjunctiva/sclera: Conjunctivae normal.     Pupils: Pupils are equal, round, and reactive to light.  Neck:     Thyroid: No thyroid mass or thyromegaly.  Cardiovascular:     Rate and Rhythm: Normal rate and regular rhythm.     Pulses: Normal pulses.          Radial pulses are 2+ on the right side and 2+ on the left side.       Dorsalis pedis pulses are 2+ on the right side and 2+ on the left side.     Heart sounds: Normal heart sounds, S1 normal and S2 normal. No murmur heard.    No friction rub. No gallop.  Pulmonary:     Effort: Pulmonary effort is normal. No respiratory distress.     Breath sounds: Normal breath sounds.  Abdominal:     General: Bowel sounds are normal. There is no distension.     Palpations: Abdomen is soft.  Musculoskeletal:        General: Normal range of motion.     Cervical back: Full passive range of motion without pain and normal  range of motion. No edema or erythema.     Right lower leg: No edema.     Left lower leg: No  edema.  Lymphadenopathy:     Cervical: No cervical adenopathy.  Skin:    General: Skin is warm and dry.     Capillary Refill: Capillary refill takes less than 2 seconds.  Neurological:     General: No focal deficit present.     Mental Status: She is alert and oriented to person, place, and time.     Cranial Nerves: No cranial nerve deficit.     Motor: No weakness.     Coordination: Coordination normal.     Gait: Gait normal.     Deep Tendon Reflexes: Reflexes normal.  Psychiatric:        Mood and Affect: Mood normal.        Behavior: Behavior normal.      There were no vitals taken for this visit. Wt Readings from Last 3 Encounters:  08/11/22 154 lb 6.4 oz (70 kg)  09/22/21 132 lb 8 oz (60.1 kg)  08/10/21 144 lb 8 oz (65.5 kg)       Assessment & Plan:   Problem List Items Addressed This Visit   None   I am having Byrd Cast maintain her ibuprofen , acetaminophen , and norgestimate -ethinyl estradiol .  No orders of the defined types were placed in this encounter.

## 2023-09-07 NOTE — Assessment & Plan Note (Signed)
 Encouraged patient to maintain a healthy lifestyle, including regular physical activity, adequate hydration, and a well-balanced diet. Advised limiting processed foods, added sugars, and sodium intake. Reinforced the importance of sleep, stress management, and routine preventive care. Will continue to monitor wellness and provide support at follow-up.

## 2023-09-07 NOTE — Patient Instructions (Signed)

## 2023-09-08 ENCOUNTER — Ambulatory Visit: Payer: Self-pay | Admitting: Student

## 2023-09-08 ENCOUNTER — Encounter: Payer: Self-pay | Admitting: Student

## 2023-09-08 ENCOUNTER — Ambulatory Visit (INDEPENDENT_AMBULATORY_CARE_PROVIDER_SITE_OTHER): Payer: Self-pay | Admitting: Student

## 2023-09-08 VITALS — BP 108/66 | HR 80 | Temp 98.2°F | Resp 12

## 2023-09-08 DIAGNOSIS — Z7689 Persons encountering health services in other specified circumstances: Secondary | ICD-10-CM

## 2023-09-08 DIAGNOSIS — Z833 Family history of diabetes mellitus: Secondary | ICD-10-CM

## 2023-09-08 DIAGNOSIS — Z0000 Encounter for general adult medical examination without abnormal findings: Secondary | ICD-10-CM

## 2023-09-08 LAB — COMPREHENSIVE METABOLIC PANEL WITH GFR
ALT: 17 U/L (ref 0–35)
AST: 14 U/L (ref 0–37)
Albumin: 4.4 g/dL (ref 3.5–5.2)
Alkaline Phosphatase: 48 U/L (ref 39–117)
BUN: 10 mg/dL (ref 6–23)
CO2: 30 meq/L (ref 19–32)
Calcium: 9.5 mg/dL (ref 8.4–10.5)
Chloride: 104 meq/L (ref 96–112)
Creatinine, Ser: 0.66 mg/dL (ref 0.40–1.20)
GFR: 118.54 mL/min (ref >60.00)
Glucose, Bld: 81 mg/dL (ref 70–99)
Potassium: 4 meq/L (ref 3.5–5.1)
Sodium: 140 meq/L (ref 135–145)
Total Bilirubin: 0.3 mg/dL (ref 0.2–1.2)
Total Protein: 7 g/dL (ref 6.0–8.3)

## 2023-09-08 LAB — LIPID PANEL
Cholesterol: 205 mg/dL — ABNORMAL HIGH (ref 0–200)
HDL: 48.7 mg/dL (ref >39.00)
LDL Cholesterol: 129 mg/dL — ABNORMAL HIGH (ref 0–99)
NonHDL: 156.42
Total CHOL/HDL Ratio: 4
Triglycerides: 135 mg/dL (ref 0.0–149.0)
VLDL: 27 mg/dL (ref 0.0–40.0)

## 2023-09-08 LAB — CBC WITH DIFFERENTIAL/PLATELET
Basophils Absolute: 0 10*3/uL (ref 0.0–0.1)
Basophils Relative: 0.8 % (ref 0.0–3.0)
Eosinophils Absolute: 0.1 10*3/uL (ref 0.0–0.7)
Eosinophils Relative: 3.2 % (ref 0.0–5.0)
HCT: 39.7 % (ref 36.0–46.0)
Hemoglobin: 13.4 g/dL (ref 12.0–15.0)
Lymphocytes Relative: 32.9 % (ref 12.0–46.0)
Lymphs Abs: 1.5 10*3/uL (ref 0.7–4.0)
MCHC: 33.8 g/dL (ref 30.0–36.0)
MCV: 85 fl (ref 78.0–100.0)
Monocytes Absolute: 0.3 10*3/uL (ref 0.1–1.0)
Monocytes Relative: 7.3 % (ref 3.0–12.0)
Neutro Abs: 2.5 10*3/uL (ref 1.4–7.7)
Neutrophils Relative %: 55.8 % (ref 43.0–77.0)
Platelets: 225 10*3/uL (ref 150.0–400.0)
RBC: 4.67 Mil/uL (ref 3.87–5.11)
RDW: 13.7 % (ref 11.5–15.5)
WBC: 4.5 10*3/uL (ref 4.0–10.5)

## 2023-09-08 LAB — HEMOGLOBIN A1C: Hgb A1c MFr Bld: 5.4 % (ref 4.6–6.5)

## 2023-09-08 NOTE — Assessment & Plan Note (Signed)
 Check A1c today.

## 2024-01-09 ENCOUNTER — Encounter: Payer: Self-pay | Admitting: Student

## 2024-01-10 NOTE — Telephone Encounter (Signed)
 Spoke with patient and she states that her right arm looks swollen and her chest looks swollen.  Advised that she needs to be seen.  Appt made for tomorrow with pcp.

## 2024-01-10 NOTE — Progress Notes (Unsigned)
   Acute Office Visit  Subjective:     Patient ID: Kathryn Benjamin, female    DOB: 01-08-1996, 28 y.o.   MRN: [id number]  No chief complaint on file.   HPI Patient is in today for ***  ROS      Objective:    There were no vitals taken for this visit. {Vitals History (Optional):23777}  Physical Exam  No results found for any visits on 01/11/24.      Assessment & Plan:   Problem List Items Addressed This Visit   None   No orders of the defined types were placed in this encounter.   No follow-ups on file.  Leanore Biggers L Angelik Walls, NP

## 2024-01-11 ENCOUNTER — Encounter: Payer: Self-pay | Admitting: Student

## 2024-01-11 ENCOUNTER — Ambulatory Visit (INDEPENDENT_AMBULATORY_CARE_PROVIDER_SITE_OTHER): Payer: Self-pay | Admitting: Student

## 2024-01-11 VITALS — BP 101/65 | HR 77 | Temp 98.2°F | Resp 16 | Ht 61.0 in | Wt 146.5 lb

## 2024-01-11 DIAGNOSIS — M79602 Pain in left arm: Secondary | ICD-10-CM

## 2024-01-11 DIAGNOSIS — T148XXA Other injury of unspecified body region, initial encounter: Secondary | ICD-10-CM

## 2024-01-11 MED ORDER — MELOXICAM 15 MG PO TABS: 15.0000 mg | ORAL_TABLET | Freq: Every day | ORAL | 0 refills | Status: DC | PRN

## 2024-04-25 ENCOUNTER — Encounter: Payer: Self-pay | Admitting: Student

## 2024-04-25 DIAGNOSIS — N912 Amenorrhea, unspecified: Secondary | ICD-10-CM | POA: Insufficient documentation

## 2024-04-25 NOTE — Progress Notes (Addendum)
 "  Acute Office Visit  Subjective:     Patient ID: Kathryn Benjamin, female    DOB: [birth year], 29 y.o.   MRN: [id number]  Chief Complaint  Patient presents with   Amenorrhea    Patient had her tubes tied on last June and has not had her period in the last 3 months.     History of Present Illness Kathryn Benjamin is a 29 year old female who presents with irregular menstruation and abdominal pain following salpingectomy. She underwent salpingectomy in June 2025 and has had no menstrual period since the surgery. She has not used any birth control since the procedure and is concerned that her menses have stopped.  She has intermittent abdominal pain since the surgery, described as pressure and discomfort at the surgical site, worse when sitting or at night. She sometimes feels swelling on one side. She denies urinary symptoms, hematuria, blood in stool, vaginal discharge. Bowel movements are regular.  Patient denies fever, chills, SOB, CP, palpitations, dyspnea, edema, HA, vision changes, N/V/D, abdominal pain, urinary symptoms, rash, weight changes, and recent illness or hospitalizations.    ROS  See HPI     Objective:    BP 118/78 (BP Location: Left Arm, Patient Position: Sitting, Cuff Size: Normal)   Pulse 78   Resp 16   Ht 5' 1 (1.549 m)   Wt 150 lb (68 kg)   LMP 01/08/2024 (Approximate)   SpO2 96%   BMI 28.34 kg/m    Physical Exam Vitals reviewed.  Constitutional:      General: She is not in acute distress.    Appearance: She is not toxic-appearing.  HENT:     Head: Normocephalic and atraumatic.     Mouth/Throat:     Mouth: Mucous membranes are moist.     Pharynx: Oropharynx is clear.  Eyes:     Pupils: Pupils are equal, round, and reactive to light.  Cardiovascular:     Rate and Rhythm: Normal rate and regular rhythm.     Pulses: Normal pulses.     Heart sounds: Normal heart sounds. No murmur heard. Pulmonary:     Effort: Pulmonary effort is  normal. No respiratory distress.     Breath sounds: Normal breath sounds. No wheezing.  Abdominal:     General: Abdomen is flat. Bowel sounds are normal. There is no distension.     Palpations: Abdomen is soft. There is no mass.     Tenderness: There is abdominal tenderness. There is no guarding.     Hernia: No hernia is present.  Musculoskeletal:        General: No swelling.     Cervical back: Neck supple.  Skin:    General: Skin is warm and dry.  Neurological:     General: No focal deficit present.     Mental Status: She is alert and oriented to person, place, and time.  Psychiatric:        Mood and Affect: Mood normal.        Behavior: Behavior normal.        Thought Content: Thought content normal.        Judgment: Judgment normal.     No results found for any visits on 04/26/24.      Assessment & Plan:   Problem List Items Addressed This Visit       Other   Amenorrhea - Primary   Relevant Orders   hCG, serum, qualitative   TSH   US  PELVIC  COMPLETE WITH TRANSVAGINAL   Prolactin   Other Visit Diagnoses       Abnormal menses       Relevant Orders   hCG, serum, qualitative   TSH   US  PELVIC COMPLETE WITH TRANSVAGINAL     Muscle strain       Relevant Medications   meloxicam  (MOBIC ) 15 MG tablet     Abdominal pain, unspecified abdominal location       Relevant Orders   Urinalysis   Urine Culture     Hx salpingectomy June 2025, presenting with amenorrhea since procedure -Order TSH, hCG, prolactin -Order Pelvic US   -UA, Urine culture  If workup is unrevealing, will monitor and reassess; GYN referral if amenorrhea persists. Reviewed return precautions.         Meds ordered this encounter  Medications   meloxicam  (MOBIC ) 15 MG tablet    Sig: Take 1 tablet (15 mg total) by mouth daily as needed for pain.    Dispense:  15 tablet    Refill:  0    No follow-ups on file.  Harlene LITTIE Jolly, NP   "

## 2024-04-26 ENCOUNTER — Ambulatory Visit: Payer: Self-pay | Admitting: Student

## 2024-04-26 ENCOUNTER — Encounter: Payer: Self-pay | Admitting: Student

## 2024-04-26 VITALS — BP 118/78 | HR 78 | Resp 16 | Ht 61.0 in | Wt 150.0 lb

## 2024-04-26 DIAGNOSIS — R109 Unspecified abdominal pain: Secondary | ICD-10-CM

## 2024-04-26 DIAGNOSIS — T148XXA Other injury of unspecified body region, initial encounter: Secondary | ICD-10-CM

## 2024-04-26 DIAGNOSIS — N926 Irregular menstruation, unspecified: Secondary | ICD-10-CM

## 2024-04-26 DIAGNOSIS — N912 Amenorrhea, unspecified: Secondary | ICD-10-CM

## 2024-04-26 MED ORDER — MELOXICAM 15 MG PO TABS: 15.0000 mg | ORAL_TABLET | Freq: Every day | ORAL | 0 refills | Status: AC | PRN

## 2024-04-26 NOTE — Addendum Note (Signed)
 Addended by: TRUDY CURVIN RAMAN on: 04/26/2024 03:52 PM   Modules accepted: Orders

## 2024-04-27 LAB — URINE CULTURE
MICRO NUMBER:: 17508546
SPECIMEN QUALITY:: ADEQUATE

## 2024-04-27 LAB — URINALYSIS
Bilirubin Urine: NEGATIVE
Glucose, UA: NEGATIVE
Ketones, ur: NEGATIVE
Leukocytes,Ua: NEGATIVE
Nitrite: NEGATIVE
Protein, ur: NEGATIVE
Specific Gravity, Urine: 1.023 (ref 1.001–1.035)
pH: 5.5 (ref 5.0–8.0)

## 2024-04-27 LAB — PROLACTIN: Prolactin: 9.6 ng/mL

## 2024-04-27 LAB — HCG, SERUM, QUALITATIVE: Preg, Serum: NEGATIVE

## 2024-04-27 LAB — TSH: TSH: 1.51 m[IU]/L

## 2024-04-29 ENCOUNTER — Ambulatory Visit: Payer: Self-pay | Admitting: Student

## 2024-04-29 DIAGNOSIS — R8290 Unspecified abnormal findings in urine: Secondary | ICD-10-CM

## 2024-05-02 ENCOUNTER — Telehealth: Payer: Self-pay | Admitting: Student

## 2024-05-02 ENCOUNTER — Other Ambulatory Visit: Payer: Self-pay

## 2024-05-02 DIAGNOSIS — N912 Amenorrhea, unspecified: Secondary | ICD-10-CM

## 2024-05-02 NOTE — Addendum Note (Signed)
 Addended by: VALLI TILLMAN BROCKS on: 05/02/2024 11:20 AM   Modules accepted: Orders

## 2024-05-02 NOTE — Telephone Encounter (Signed)
 Pt has been referred to GYN

## 2024-05-14 ENCOUNTER — Ambulatory Visit: Payer: Self-pay | Admitting: Obstetrics and Gynecology

## 2024-09-11 ENCOUNTER — Encounter: Admitting: Student
